# Patient Record
Sex: Female | Born: 1945 | Race: White | Hispanic: No | Marital: Single | Smoking: Never smoker
Health system: Southern US, Community
[De-identification: ages and names within clinical notes are randomized; demographics above are authoritative.]

## PROBLEM LIST (undated history)

## (undated) DIAGNOSIS — H359 Unspecified retinal disorder: Secondary | ICD-10-CM

## (undated) DIAGNOSIS — M1990 Unspecified osteoarthritis, unspecified site: Secondary | ICD-10-CM

## (undated) DIAGNOSIS — I10 Essential (primary) hypertension: Secondary | ICD-10-CM

## (undated) DIAGNOSIS — F0390 Unspecified dementia without behavioral disturbance: Secondary | ICD-10-CM

## (undated) DIAGNOSIS — C801 Malignant (primary) neoplasm, unspecified: Secondary | ICD-10-CM

## (undated) HISTORY — DX: Unspecified dementia, unspecified severity, without behavioral disturbance, psychotic disturbance, mood disturbance, and anxiety: F03.90

## (undated) HISTORY — DX: Essential (primary) hypertension: I10

## (undated) HISTORY — PX: TONSILLECTOMY: SUR1361

## (undated) HISTORY — PX: ABDOMINAL HYSTERECTOMY: SHX81

## (undated) HISTORY — PX: JOINT REPLACEMENT: SHX530

---

## 2016-09-01 ENCOUNTER — Ambulatory Visit
Admission: RE | Admit: 2016-09-01 | Discharge: 2016-09-01 | Disposition: A | Discharge status: Home / Self Care | Payer: Medicare Other | Source: Ambulatory Visit | Attending: Student | Admitting: Student

## 2016-09-01 ENCOUNTER — Other Ambulatory Visit: Payer: Self-pay | Admitting: Student

## 2016-09-01 DIAGNOSIS — M25562 Pain in left knee: Secondary | ICD-10-CM

## 2016-09-01 DIAGNOSIS — R600 Localized edema: Secondary | ICD-10-CM

## 2017-02-04 ENCOUNTER — Other Ambulatory Visit: Payer: Self-pay | Admitting: Neurology

## 2017-02-04 DIAGNOSIS — R4189 Other symptoms and signs involving cognitive functions and awareness: Secondary | ICD-10-CM

## 2017-02-15 ENCOUNTER — Encounter: Payer: Self-pay | Admitting: Radiology

## 2017-02-15 ENCOUNTER — Ambulatory Visit
Admission: RE | Admit: 2017-02-15 | Discharge: 2017-02-15 | Disposition: A | Discharge status: Home / Self Care | Payer: Medicare HMO | Source: Ambulatory Visit | Attending: Neurology | Admitting: Neurology

## 2017-02-15 DIAGNOSIS — I6782 Cerebral ischemia: Secondary | ICD-10-CM | POA: Insufficient documentation

## 2017-02-15 DIAGNOSIS — R4189 Other symptoms and signs involving cognitive functions and awareness: Secondary | ICD-10-CM | POA: Insufficient documentation

## 2017-02-15 MED ORDER — GADOBENATE DIMEGLUMINE 529 MG/ML IV SOLN
20.0000 mL | Freq: Once | INTRAVENOUS | Status: AC | PRN
  Administered 2017-02-15: 20 mL via INTRAVENOUS

## 2019-02-23 DIAGNOSIS — Z6841 Body Mass Index (BMI) 40.0 and over, adult: Secondary | ICD-10-CM | POA: Insufficient documentation

## 2019-04-18 ENCOUNTER — Other Ambulatory Visit: Payer: Self-pay | Admitting: Family Medicine

## 2019-04-18 DIAGNOSIS — Z1231 Encounter for screening mammogram for malignant neoplasm of breast: Secondary | ICD-10-CM

## 2019-04-23 DIAGNOSIS — F0280 Dementia in other diseases classified elsewhere without behavioral disturbance: Secondary | ICD-10-CM | POA: Insufficient documentation

## 2019-04-25 ENCOUNTER — Ambulatory Visit
Admission: RE | Admit: 2019-04-25 | Discharge: 2019-04-25 | Disposition: A | Discharge status: Home / Self Care | Payer: Medicare HMO | Source: Ambulatory Visit | Attending: Family Medicine | Admitting: Family Medicine

## 2019-04-25 DIAGNOSIS — Z1231 Encounter for screening mammogram for malignant neoplasm of breast: Secondary | ICD-10-CM | POA: Insufficient documentation

## 2019-05-22 ENCOUNTER — Encounter (INDEPENDENT_AMBULATORY_CARE_PROVIDER_SITE_OTHER): Payer: Medicare HMO | Admitting: Ophthalmology

## 2019-05-28 ENCOUNTER — Encounter (INDEPENDENT_AMBULATORY_CARE_PROVIDER_SITE_OTHER): Payer: Self-pay | Admitting: Ophthalmology

## 2019-05-28 ENCOUNTER — Ambulatory Visit (INDEPENDENT_AMBULATORY_CARE_PROVIDER_SITE_OTHER): Payer: Medicare HMO | Admitting: Ophthalmology

## 2019-05-28 ENCOUNTER — Other Ambulatory Visit: Payer: Self-pay

## 2019-05-28 DIAGNOSIS — H3561 Retinal hemorrhage, right eye: Secondary | ICD-10-CM

## 2019-05-28 DIAGNOSIS — H2511 Age-related nuclear cataract, right eye: Secondary | ICD-10-CM | POA: Diagnosis not present

## 2019-05-28 DIAGNOSIS — H2510 Age-related nuclear cataract, unspecified eye: Secondary | ICD-10-CM | POA: Insufficient documentation

## 2019-05-28 DIAGNOSIS — H348312 Tributary (branch) retinal vein occlusion, right eye, stable: Secondary | ICD-10-CM

## 2019-05-28 DIAGNOSIS — H2512 Age-related nuclear cataract, left eye: Secondary | ICD-10-CM

## 2019-05-28 HISTORY — DX: Tributary (branch) retinal vein occlusion, right eye, stable: H34.8312

## 2019-05-28 HISTORY — DX: Retinal hemorrhage, right eye: H35.61

## 2019-05-28 NOTE — Progress Notes (Signed)
05/28/2019     CHIEF COMPLAINT Patient presents for Retina Follow Up   HISTORY OF PRESENT ILLNESS: Kimberly Small is a 74 y.o. female who presents to the clinic today for:   HPI    Retina Follow Up    Patient presents with  CRVO/BRVO.  In both eyes.  Duration of 1 year.  Since onset it is stable.          Comments    1 year follow up - OCT OU Patient denies change in vision and overall has no complaints.        Last edited by Gerda Diss on 05/28/2019  9:49 AM. (History)      Referring physician: [name], MD [address] [facility] [place],  [place] [address]  HISTORICAL INFORMATION:   Selected notes from the MEDICAL RECORD NUMBER       CURRENT MEDICATIONS: Current Outpatient Medications (Ophthalmic Drugs)  Medication Sig  . Polyethyl Glycol-Propyl Glycol 0.4-0.3 % SOLN Apply to eye.   No current facility-administered medications for this visit. (Ophthalmic Drugs)   Current Outpatient Medications (Other)  Medication Sig  . donepezil (ARICEPT) 5 MG tablet Take by mouth.  . losartan (COZAAR) 25 MG tablet Take by mouth.  Marland Kitchen aspirin 81 MG chewable tablet Chew by mouth.  . Cholecalciferol 25 MCG (1000 UT) tablet Take by mouth.  . cyanocobalamin 1000 MCG tablet Take by mouth.  . Multiple Vitamins-Minerals (MULTIVITAMIN ADULT EXTRA C PO) Take by mouth.   No current facility-administered medications for this visit. (Other)      REVIEW OF SYSTEMS:    ALLERGIES Allergies  Allergen Reactions  . Erythromycin     PAST MEDICAL HISTORY Past Medical History:  Diagnosis Date  . Dementia ([place])   . Hypertension    History reviewed. No pertinent surgical history.  FAMILY HISTORY Family History  Problem Relation Age of Onset  . Breast cancer Neg Hx     SOCIAL HISTORY Social History   Tobacco Use  . Smoking status: Never Smoker  . Smokeless tobacco: Never Used  Substance Use Topics  . Alcohol use: Not on file  . Drug use: Not on file          OPHTHALMIC EXAM: Base Eye Exam    Visual Acuity (Snellen - Linear)      Right Left   Dist [place] 20/40-2 20/25-2   Dist ph [place] NI    Correction: Glasses       Tonometry (Tonopen, 9:56 AM)      Right Left   Pressure 13 12       Pupils      Pupils Dark Light Shape React APD   Right PERRL 6 4 Round Brisk None   Left PERRL 6 4 Round Brisk None       Visual Fields (Counting fingers)      Left Right    Full Full       Extraocular Movement      Right Left    Full Full       Neuro/Psych    Oriented x3: Yes   Mood/Affect: Normal       Dilation    Both eyes: 1.0% Mydriacyl, 2.5% Phenylephrine @ 9:56 AM        Slit Lamp and Fundus Exam    External Exam      Right Left   External Normal Normal       Slit Lamp Exam  Right Left   Lids/Lashes Normal Normal   Conjunctiva/Sclera White and quiet White and quiet   Cornea Clear Clear   Anterior Chamber Deep and quiet Deep and quiet   Iris Round and reactive Round and reactive   Vitreous Normal Normal          IMAGING AND PROCEDURES  Imaging and Procedures for 05/28/19  OCT, Retina - OU - Both Eyes       Right Eye Quality was good. Scan locations included subfoveal. Central Foveal Thickness: 300.   Left Eye Quality was good. Scan locations included subfoveal. Central Foveal Thickness: 287.                 ASSESSMENT/PLAN:  No problem-specific Assessment & Plan notes found for this encounter.      ICD-10-CM   1. Branch retinal vein occlusion of right eye, unspecified complication status  0000000   2. Retinal hemorrhage of right eye  H35.61   3. Nuclear sclerotic cataract of left eye  H25.12   4. Nuclear sclerotic cataract of right eye  H25.11     1.  2.  3.  Ophthalmic Meds Ordered this visit:  No orders of the defined types were placed in this encounter.      No follow-ups on file.  There are no Patient Instructions on file for this visit.   Explained the diagnoses,  plan, and follow up with the patient and they expressed understanding.  Patient expressed understanding of the importance of proper follow up care.   Clent Demark Henrick Mcgue M.D. Diseases & Surgery of the Retina and Vitreous [facility] [place] 05/28/19     Abbreviations: M myopia (nearsighted); A astigmatism; H hyperopia (farsighted); P presbyopia; Mrx spectacle prescription;  CTL contact lenses; OD right eye; OS left eye; OU both eyes  XT exotropia; ET esotropia; PEK punctate epithelial keratitis; PEE punctate epithelial erosions; DES dry eye syndrome; MGD meibomian gland dysfunction; ATs artificial tears; PFAT's preservative free artificial tears; [place] nuclear sclerotic cataract; PSC posterior subcapsular cataract; ERM epi-retinal membrane; PVD posterior vitreous detachment; RD retinal detachment; DM diabetes mellitus; DR diabetic retinopathy; NPDR non-proliferative diabetic retinopathy; PDR proliferative diabetic retinopathy; CSME clinically significant macular edema; DME diabetic macular edema; dbh dot blot hemorrhages; CWS cotton wool spot; POAG primary open angle glaucoma; C/D cup-to-disc ratio; HVF humphrey visual field; GVF goldmann visual field; OCT optical coherence tomography; IOP intraocular pressure; BRVO Branch retinal vein occlusion; CRVO central retinal vein occlusion; CRAO central retinal artery occlusion; BRAO branch retinal artery occlusion; RT retinal tear; SB scleral buckle; PPV pars plana vitrectomy; VH Vitreous hemorrhage; PRP panretinal laser photocoagulation; IVK intravitreal kenalog; VMT vitreomacular traction; MH Macular hole;  NVD neovascularization of the disc; NVE neovascularization elsewhere; AREDS age related eye disease study; ARMD age related macular degeneration; POAG primary open angle glaucoma; EBMD epithelial/anterior basement membrane dystrophy; ACIOL anterior chamber intraocular lens; IOL intraocular lens; PCIOL posterior chamber intraocular lens; Phaco/IOL  phacoemulsification with intraocular lens placement; [place] photorefractive keratectomy; LASIK laser assisted in situ keratomileusis; HTN hypertension; DM diabetes mellitus; COPD chronic obstructive pulmonary disease

## 2019-05-28 NOTE — Assessment & Plan Note (Signed)

## 2019-10-08 ENCOUNTER — Other Ambulatory Visit
Admission: RE | Admit: 2019-10-08 | Discharge: 2019-10-08 | Disposition: A | Discharge status: Home / Self Care | Payer: Medicare HMO | Source: Ambulatory Visit | Attending: Internal Medicine | Admitting: Internal Medicine

## 2019-10-08 ENCOUNTER — Other Ambulatory Visit: Payer: Self-pay

## 2019-10-08 DIAGNOSIS — Z01812 Encounter for preprocedural laboratory examination: Secondary | ICD-10-CM | POA: Insufficient documentation

## 2019-10-08 DIAGNOSIS — Z20822 Contact with and (suspected) exposure to covid-19: Secondary | ICD-10-CM | POA: Diagnosis not present

## 2019-10-08 LAB — SARS CORONAVIRUS 2 (TAT 6-24 HRS): SARS Coronavirus 2: NEGATIVE

## 2019-10-09 ENCOUNTER — Encounter: Payer: Self-pay | Admitting: Internal Medicine

## 2019-10-10 ENCOUNTER — Ambulatory Visit: Payer: Medicare HMO | Admitting: Certified Registered"

## 2019-10-10 ENCOUNTER — Encounter
Admission: RE | Disposition: A | Discharge status: Home / Self Care | Payer: Self-pay | Source: Home / Self Care | Attending: Internal Medicine

## 2019-10-10 ENCOUNTER — Ambulatory Visit
Admission: RE | Admit: 2019-10-10 | Discharge: 2019-10-10 | Disposition: A | Discharge status: Home / Self Care | Payer: Medicare HMO | Attending: Internal Medicine | Admitting: Internal Medicine

## 2019-10-10 ENCOUNTER — Encounter: Payer: Self-pay | Admitting: Internal Medicine

## 2019-10-10 ENCOUNTER — Other Ambulatory Visit: Payer: Self-pay

## 2019-10-10 DIAGNOSIS — D123 Benign neoplasm of transverse colon: Secondary | ICD-10-CM | POA: Insufficient documentation

## 2019-10-10 DIAGNOSIS — Z1211 Encounter for screening for malignant neoplasm of colon: Secondary | ICD-10-CM | POA: Diagnosis not present

## 2019-10-10 DIAGNOSIS — Z79899 Other long term (current) drug therapy: Secondary | ICD-10-CM | POA: Diagnosis not present

## 2019-10-10 DIAGNOSIS — Z7982 Long term (current) use of aspirin: Secondary | ICD-10-CM | POA: Insufficient documentation

## 2019-10-10 DIAGNOSIS — I10 Essential (primary) hypertension: Secondary | ICD-10-CM | POA: Diagnosis not present

## 2019-10-10 DIAGNOSIS — Z8371 Family history of colonic polyps: Secondary | ICD-10-CM | POA: Insufficient documentation

## 2019-10-10 DIAGNOSIS — K5730 Diverticulosis of large intestine without perforation or abscess without bleeding: Secondary | ICD-10-CM | POA: Insufficient documentation

## 2019-10-10 DIAGNOSIS — K640 First degree hemorrhoids: Secondary | ICD-10-CM | POA: Diagnosis not present

## 2019-10-10 DIAGNOSIS — F0390 Unspecified dementia without behavioral disturbance: Secondary | ICD-10-CM | POA: Diagnosis not present

## 2019-10-10 DIAGNOSIS — Z800 Family history of malignant neoplasm of digestive organs: Secondary | ICD-10-CM | POA: Diagnosis not present

## 2019-10-10 DIAGNOSIS — M1990 Unspecified osteoarthritis, unspecified site: Secondary | ICD-10-CM | POA: Insufficient documentation

## 2019-10-10 HISTORY — DX: Unspecified retinal disorder: H35.9

## 2019-10-10 HISTORY — PX: COLONOSCOPY: SHX5424

## 2019-10-10 HISTORY — DX: Unspecified osteoarthritis, unspecified site: M19.90

## 2019-10-10 HISTORY — DX: Malignant (primary) neoplasm, unspecified: C80.1

## 2019-10-10 SURGERY — COLONOSCOPY
Anesthesia: General

## 2019-10-10 MED ORDER — PROPOFOL 500 MG/50ML IV EMUL
INTRAVENOUS | Status: DC | PRN
  Administered 2019-10-10: 155 ug/kg/min via INTRAVENOUS

## 2019-10-10 MED ORDER — PROPOFOL 10 MG/ML IV BOLUS
INTRAVENOUS | Status: DC | PRN
  Administered 2019-10-10 (×2): 10 mg via INTRAVENOUS
  Administered 2019-10-10: 50 mg via INTRAVENOUS

## 2019-10-10 MED ORDER — LIDOCAINE HCL (CARDIAC) PF 100 MG/5ML IV SOSY
PREFILLED_SYRINGE | INTRAVENOUS | Status: DC | PRN
  Administered 2019-10-10: 100 mg via INTRAVENOUS

## 2019-10-10 MED ORDER — SODIUM CHLORIDE 0.9 % IV SOLN: INTRAVENOUS | Status: DC

## 2019-10-10 NOTE — Op Note (Signed)
[facility] Gastroenterology Patient Name: Kimberly Small Procedure Date: 10/10/2019 11:42 AM MRN: [id number] Account #: [id number] Date of Birth: February 01, 1945 Admit Type: Outpatient Age: 74 Room: [facility] ENDO ROOM 4 Gender: Female Note Status: Finalized Procedure:             Colonoscopy Indications:           Colon cancer screening in patient at increased risk:                         Family history of 1st-degree relative with colon                         polyps, Screening in patient at increased risk: Family                         history of 1st-degree relative with colorectal cancer Providers:             Benay Pike. Alice Reichert MD, MD Referring MD:          Irven Easterly. Kary Kos, MD (Referring MD) Medicines:             Propofol per Anesthesia Complications:         No immediate complications. Procedure:             Pre-Anesthesia Assessment:                        - The risks and benefits of the procedure and the                         sedation options and risks were discussed with the                         patient. All questions were answered and informed                         consent was obtained.                        - Patient identification and proposed procedure were                         verified prior to the procedure by the nurse. The                         procedure was verified in the procedure room.                        - ASA Grade Assessment: II - A patient with mild                         systemic disease.                        - After reviewing the risks and benefits, the patient                         was deemed in satisfactory condition to undergo the  procedure.                        After obtaining informed consent, the colonoscope was                         passed under direct vision. Throughout the procedure,                         the patient's blood pressure, pulse, and oxygen                         saturations  were monitored continuously. The                         Colonoscope was introduced through the anus and                         advanced to the the cecum, identified by appendiceal                         orifice and ileocecal valve. The colonoscopy was                         performed without difficulty. The patient tolerated                         the procedure well. The quality of the bowel                         preparation was adequate. The ileocecal valve,                         appendiceal orifice, and rectum were photographed. Findings:      The perianal and digital rectal examinations were normal. Pertinent       negatives include normal sphincter tone and no palpable rectal lesions.      Non-bleeding internal hemorrhoids were found during retroflexion. The       hemorrhoids were Grade I (internal hemorrhoids that do not prolapse).      Many medium-mouthed diverticula were found in the entire colon.      Three sessile polyps were found in the splenic flexure and hepatic       flexure. The polyps were 3 to 5 mm in size. These polyps were removed       with a jumbo cold forceps. Resection and retrieval were complete.      The exam was otherwise without abnormality. Impression:            - Non-bleeding internal hemorrhoids.                        - Diverticulosis in the entire examined colon.                        - Three 3 to 5 mm polyps at the splenic flexure and at                         the hepatic flexure, removed with a jumbo cold  forceps. Resected and retrieved.                        - The examination was otherwise normal. Recommendation:        - Patient has a contact number available for                         emergencies. The signs and symptoms of potential                         delayed complications were discussed with the patient.                         Return to normal activities tomorrow. Written                         discharge  instructions were provided to the patient.                        - Resume previous diet.                        - Continue present medications.                        - If polyps are benign or adenomatous without                         dysplasia, I will advise NO further colonoscopy due to                         advanced age and/or severe comorbidity.                        - Return to GI office PRN.                        - The findings and recommendations were discussed with                         the patient. Procedure Code(s):     --- Professional ---                        [phone], Colonoscopy, flexible; with biopsy, single or                         multiple Diagnosis Code(s):     --- Professional ---                        K57.30, Diverticulosis of large intestine without                         perforation or abscess without bleeding                        K63.5, Polyp of colon                        K64.0, First degree hemorrhoids  Z80.0, Family history of malignant neoplasm of                         digestive organs                        Z83.71, Family history of colonic polyps CPT copyright 2019 American Medical Association. All rights reserved. The codes documented in this report are preliminary and upon coder review may  be revised to meet current compliance requirements. Efrain Sella MD, MD 10/10/2019 12:28:59 PM This report has been signed electronically. Number of Addenda: 0 Note Initiated On: 10/10/2019 11:42 AM Scope Withdrawal Time: 0 hours 17 minutes 40 seconds  Total Procedure Duration: 0 hours 26 minutes 3 seconds  Estimated Blood Loss:  Estimated blood loss: none.      [facility]

## 2019-10-10 NOTE — Transfer of Care (Signed)
Immediate Anesthesia Transfer of Care Note  Patient: SHANDELLE BORRELLI  Procedure(s) Performed: COLONOSCOPY (N/A )  Patient Location: Endoscopy Unit  Anesthesia Type:General  Level of Consciousness: drowsy, patient cooperative and responds to stimulation  Airway & Oxygen Therapy: Patient Spontanous Breathing and Patient connected to face mask oxygen  Post-op Assessment: Report given to RN and Post -op Vital signs reviewed and stable  Post vital signs: Reviewed and stable  Last Vitals:  Vitals Value Taken Time  BP 113/65 10/10/19 1231  Temp 36.5 C 10/10/19 1230  Pulse 53 10/10/19 1232  Resp 12 10/10/19 1232  SpO2 100 % 10/10/19 1232  Vitals shown include unvalidated device data.  Last Pain:  Vitals:   10/10/19 1230  TempSrc: Temporal  PainSc: Asleep         Complications: No complications documented.

## 2019-10-10 NOTE — Anesthesia Procedure Notes (Signed)
Procedure Name: General with mask airway Performed by: Fletcher-Harrison, Amado Andal, CRNA Pre-anesthesia Checklist: Patient identified, Emergency Drugs available, Suction available and Patient being monitored Patient Re-evaluated:Patient Re-evaluated prior to induction Oxygen Delivery Method: Simple face mask Induction Type: IV induction Placement Confirmation: positive ETCO2 and CO2 detector       

## 2019-10-10 NOTE — H&P (Signed)
Outpatient short stay form Pre-procedure 10/10/2019 11:15 AM Kimberly Small K. Alice Reichert, M.D.  Primary Physician: [name], M.D.  Reason for visit: Family history of colon cancer and polyps  History of present illness:   74 year old patient presenting for family history of colon cancer and polyps. Patient denies any change in bowel habits, rectal bleeding or involuntary weight loss.    Current Facility-Administered Medications:  .  0.9 %  sodium chloride infusion, , Intravenous, Continuous, Moody, Benay Pike, MD, Last Rate: 20 mL/hr at 10/10/19 1114, New Bag at 10/10/19 1114  Medications Prior to Admission  Medication Sig Dispense Refill Last Dose  . aspirin 81 MG chewable tablet Chew by mouth.   10/08/19  . Cholecalciferol 25 MCG (1000 UT) tablet Take by mouth.   10/08/19  . cyanocobalamin 1000 MCG tablet Take by mouth.   10/08/19  . donepezil (ARICEPT) 5 MG tablet Take by mouth.   10/08/19  . losartan (COZAAR) 25 MG tablet Take by mouth.   10/08/19  . Multiple Vitamins-Minerals (MULTIVITAMIN ADULT EXTRA C PO) Take by mouth.     Kimberly Small 0.4-0.3 % SOLN Apply to eye.        Allergies  Allergen Reactions  . Erythromycin      Past Medical History:  Diagnosis Date  . Arthritis   . Cancer ([place])   . Dementia ([place])   . Hypertension   . Retina disorder, right     Review of systems:  Otherwise negative.    Physical Exam  Gen: Alert, oriented. Appears stated age.  HEENT: [place]/AT. PERRLA. Lungs: CTA, no wheezes. CV: RR nl S1, S2. Abd: soft, benign, no masses. BS+ Ext: No edema. Pulses 2+    Planned procedures: Proceed with colonoscopy. The patient understands the nature of the planned procedure, indications, risks, alternatives and potential complications including but not limited to bleeding, infection, perforation, damage to internal organs and possible oversedation/side effects from anesthesia. The patient agrees and gives consent to proceed.  Please  refer to procedure notes for findings, recommendations and patient disposition/instructions.     [name] K. Alice Reichert, M.D. Gastroenterology 10/10/2019  11:15 AM

## 2019-10-10 NOTE — Anesthesia Postprocedure Evaluation (Signed)
Anesthesia Post Note  Patient: Kimberly Small  Procedure(s) Performed: COLONOSCOPY (N/A )  Patient location during evaluation: Endoscopy Anesthesia Type: General Level of consciousness: awake and alert and oriented Pain management: pain level controlled Vital Signs Assessment: post-procedure vital signs reviewed and stable Respiratory status: spontaneous breathing, nonlabored ventilation and respiratory function stable Cardiovascular status: blood pressure returned to baseline and stable Postop Assessment: no signs of nausea or vomiting Anesthetic complications: no   No complications documented.   Last Vitals:  Vitals:   10/10/19 1250 10/10/19 1300  BP: (!) 119/104 136/74  Pulse: (!) 52 (!) 52  Resp: 15 (!) 22  Temp:    SpO2: 97% 98%    Last Pain:  Vitals:   10/10/19 1300  TempSrc:   PainSc: 0-No pain                 Yonna Alwin

## 2019-10-10 NOTE — Anesthesia Preprocedure Evaluation (Signed)
Anesthesia Evaluation  Patient identified by MRN, date of birth, ID band Patient awake    Reviewed: Allergy & Precautions, NPO status , Patient's Chart, lab work & pertinent test results  History of Anesthesia Complications Negative for: history of anesthetic complications  Airway Mallampati: II  TM Distance: >3 FB Neck ROM: Full    Dental  (+) Poor Dentition   Pulmonary neg pulmonary ROS, neg sleep apnea, neg COPD,    breath sounds clear to auscultation- rhonchi (-) wheezing      Cardiovascular hypertension, Pt. on medications (-) CAD, (-) Past MI, (-) Cardiac Stents and (-) CABG  Rhythm:Regular Rate:Normal - Systolic murmurs and - Diastolic murmurs    Neuro/Psych PSYCHIATRIC DISORDERS Dementia negative neurological ROS     GI/Hepatic negative GI ROS, Neg liver ROS,   Endo/Other  negative endocrine ROSneg diabetes  Renal/GU negative Renal ROS     Musculoskeletal  (+) Arthritis ,   Abdominal (+) + obese,   Peds  Hematology negative hematology ROS (+)   Anesthesia Other Findings Past Medical History: No date: Arthritis No date: Cancer (HCC) No date: Dementia (HCC) No date: Hypertension No date: Retina disorder, right   Reproductive/Obstetrics                             Anesthesia Physical Anesthesia Plan  ASA: III  Anesthesia Plan: General   Post-op Pain Management:    Induction: Intravenous  PONV Risk Score and Plan: 2 and Propofol infusion  Airway Management Planned: Natural Airway  Additional Equipment:   Intra-op Plan:   Post-operative Plan:   Informed Consent: I have reviewed the patients History and Physical, chart, labs and discussed the procedure including the risks, benefits and alternatives for the proposed anesthesia with the patient or authorized representative who has indicated his/her understanding and acceptance.     Dental advisory given  Plan  Discussed with: CRNA and Anesthesiologist  Anesthesia Plan Comments:         Anesthesia Quick Evaluation

## 2019-10-11 ENCOUNTER — Encounter: Payer: Self-pay | Admitting: Internal Medicine

## 2019-10-11 LAB — SURGICAL PATHOLOGY

## 2020-05-27 ENCOUNTER — Encounter (INDEPENDENT_AMBULATORY_CARE_PROVIDER_SITE_OTHER): Payer: Medicare HMO | Admitting: Ophthalmology

## 2021-06-22 LAB — CBC AND DIFFERENTIAL
HCT: 36 (ref 36–46)
Hemoglobin: 12.1 (ref 12.0–16.0)
Platelets: 253 10*3/uL (ref 150–400)
WBC: 4.9

## 2021-06-22 LAB — BASIC METABOLIC PANEL
BUN: 17 (ref 4–21)
CO2: 30 — AB (ref 13–22)
Chloride: 106 (ref 99–108)
Creatinine: 0.9 (ref 0.5–1.1)
Glucose: 89
Potassium: 4.2 mEq/L (ref 3.5–5.1)
Sodium: 140 (ref 137–147)

## 2021-06-22 LAB — CBC: RBC: 3.9 (ref 3.87–5.11)

## 2021-06-22 LAB — COMPREHENSIVE METABOLIC PANEL
Albumin: 3.7 (ref 3.5–5.0)
Calcium: 9.3 (ref 8.7–10.7)
Globulin: 3
eGFR: 68

## 2021-06-22 LAB — HEPATIC FUNCTION PANEL
ALT: 11 U/L (ref 7–35)
AST: 13 (ref 13–35)
Alkaline Phosphatase: 63 (ref 25–125)
Bilirubin, Total: 0.5

## 2021-07-22 DIAGNOSIS — I10 Essential (primary) hypertension: Secondary | ICD-10-CM | POA: Diagnosis not present

## 2021-07-22 DIAGNOSIS — G4723 Circadian rhythm sleep disorder, irregular sleep wake type: Secondary | ICD-10-CM | POA: Diagnosis not present

## 2021-07-22 DIAGNOSIS — F22 Delusional disorders: Secondary | ICD-10-CM | POA: Diagnosis not present

## 2021-08-24 DIAGNOSIS — I10 Essential (primary) hypertension: Secondary | ICD-10-CM

## 2021-08-24 DIAGNOSIS — F0150 Vascular dementia without behavioral disturbance: Secondary | ICD-10-CM | POA: Diagnosis not present

## 2021-08-24 DIAGNOSIS — G301 Alzheimer's disease with late onset: Secondary | ICD-10-CM

## 2021-09-20 IMAGING — MG DIGITAL SCREENING BILAT W/ TOMO W/ CAD
6 of 10 series · 6 of 30 positions shown · non-contrast
Comparison: None.

ACR Breast Density Category a: The breast tissue is almost entirely
fatty.

CLINICAL DATA: Screening.

EXAM:
DIGITAL SCREENING BILATERAL MAMMOGRAM WITH TOMO AND CAD

[L MLO synth-2D]
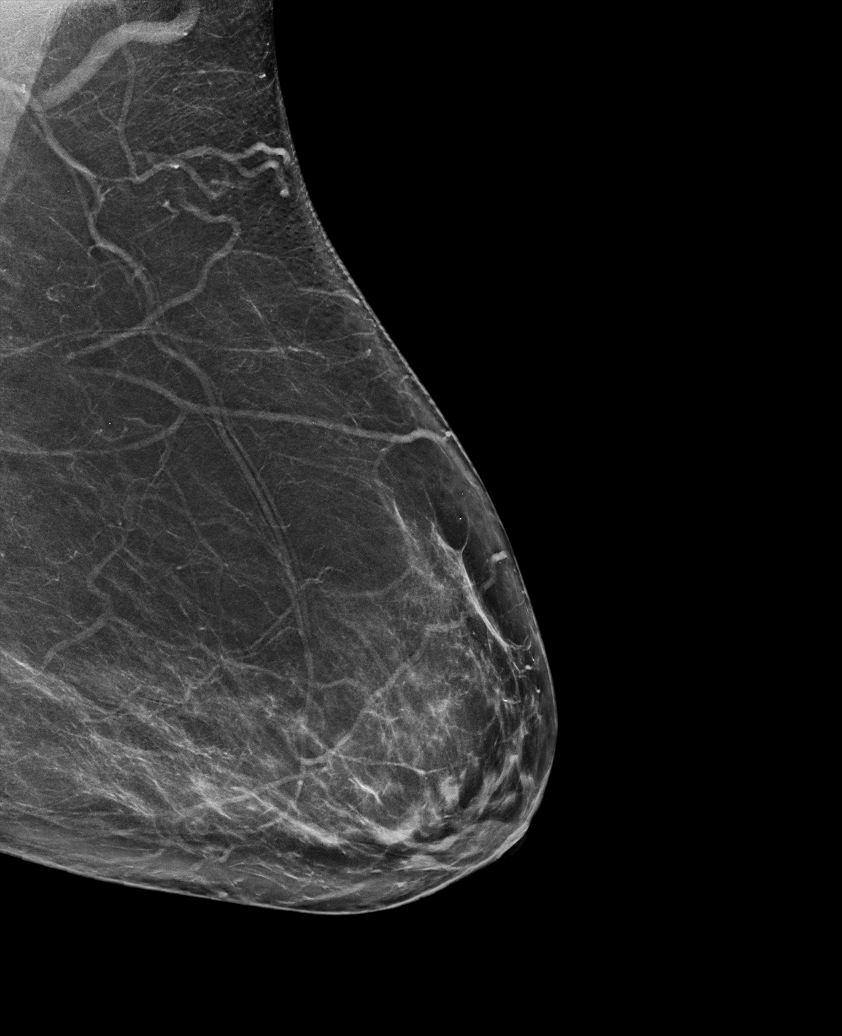

[R MLO synth-2D (1 of 2)]
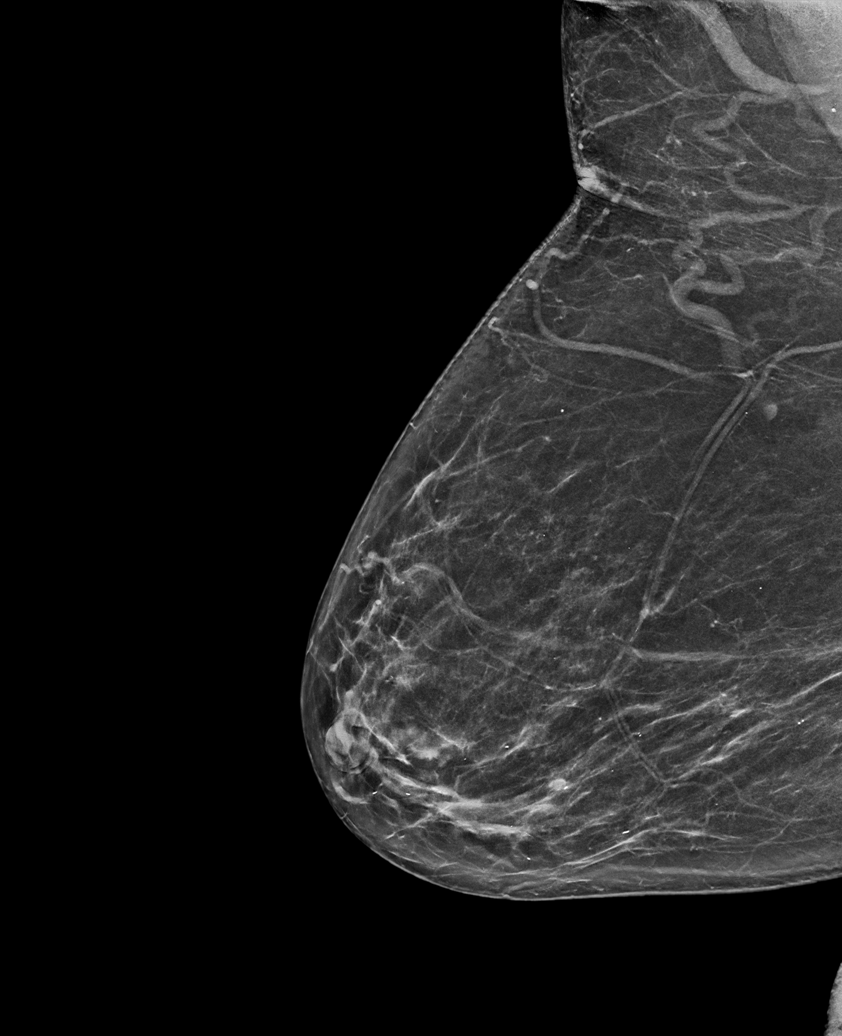

[R CC synth-2D]
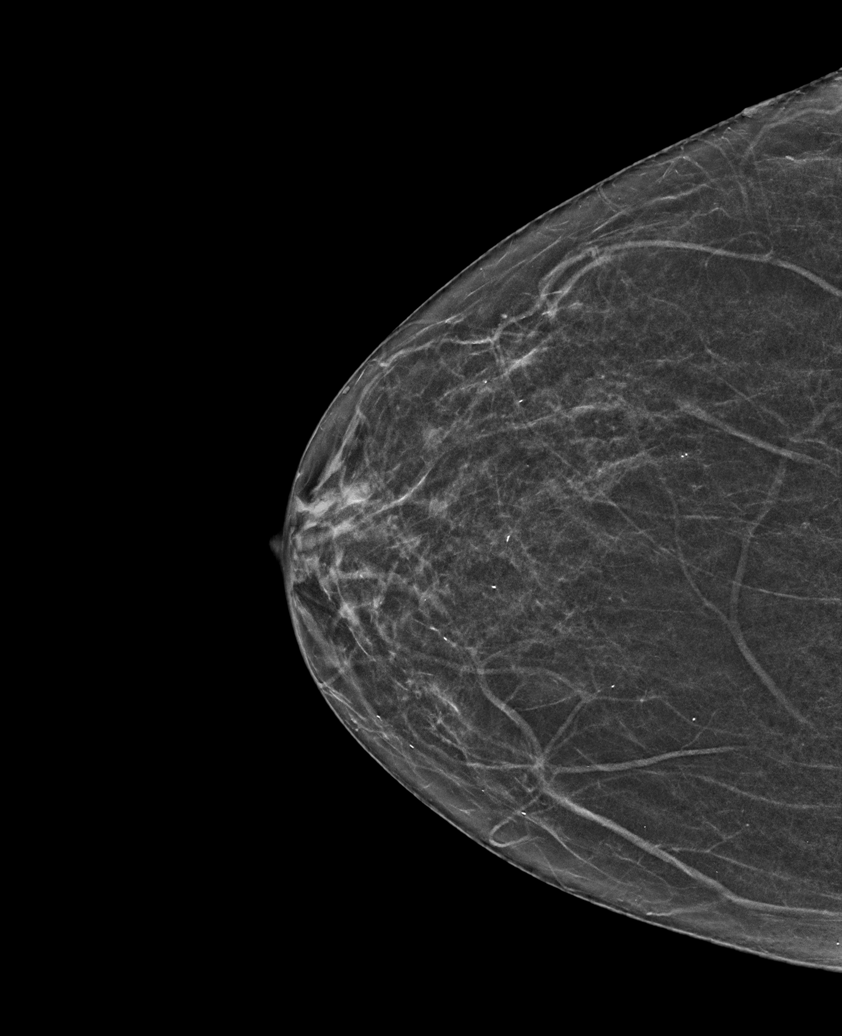

[R MLO synth-2D (2 of 2)]
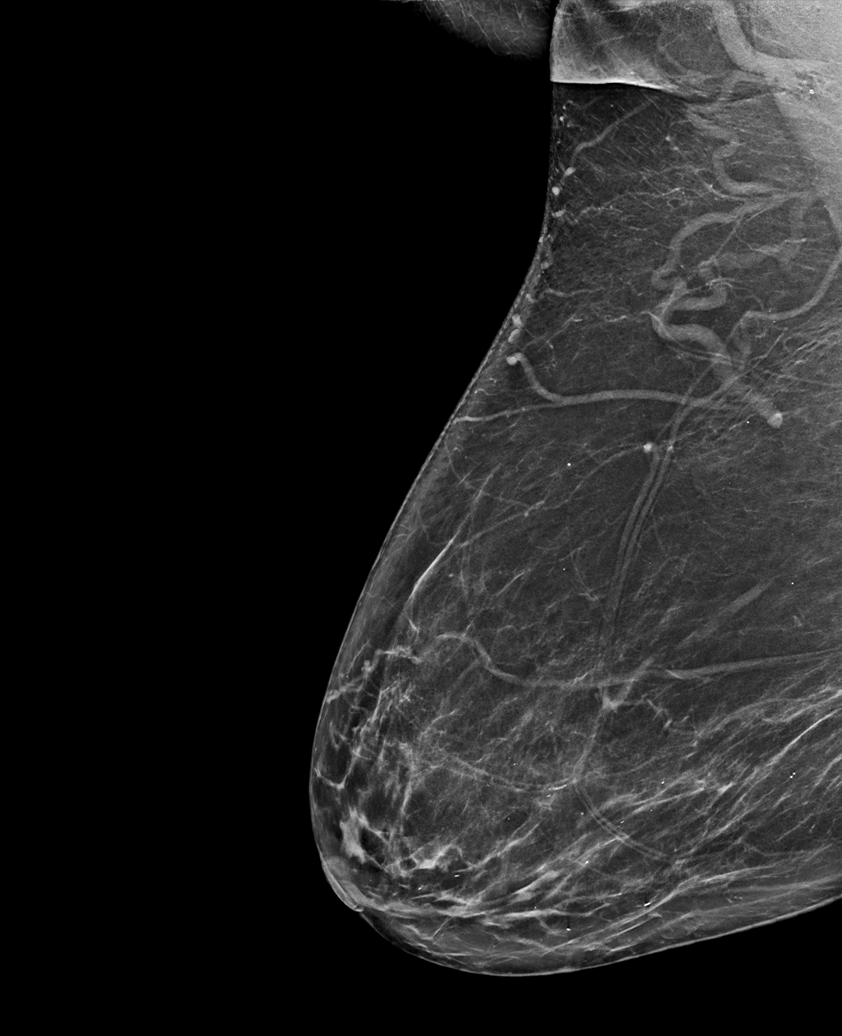

[L CC synth-2D]
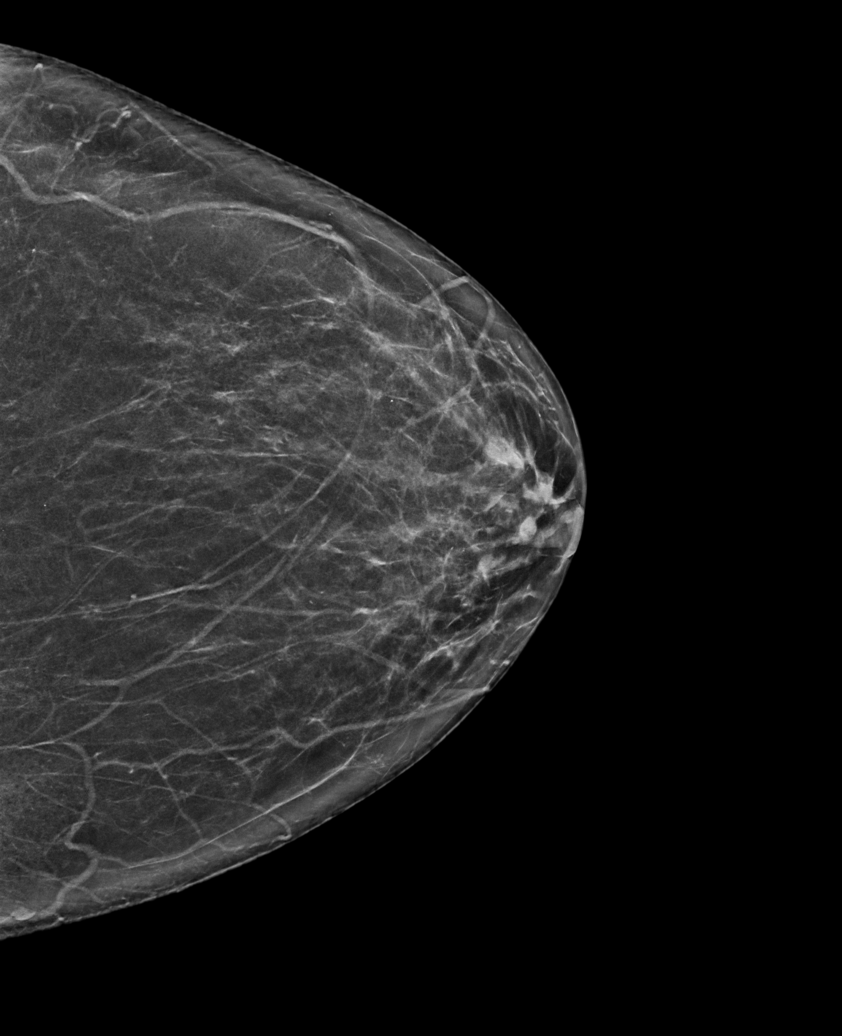

[R MLO tomo · tomo slice 35/70.0]
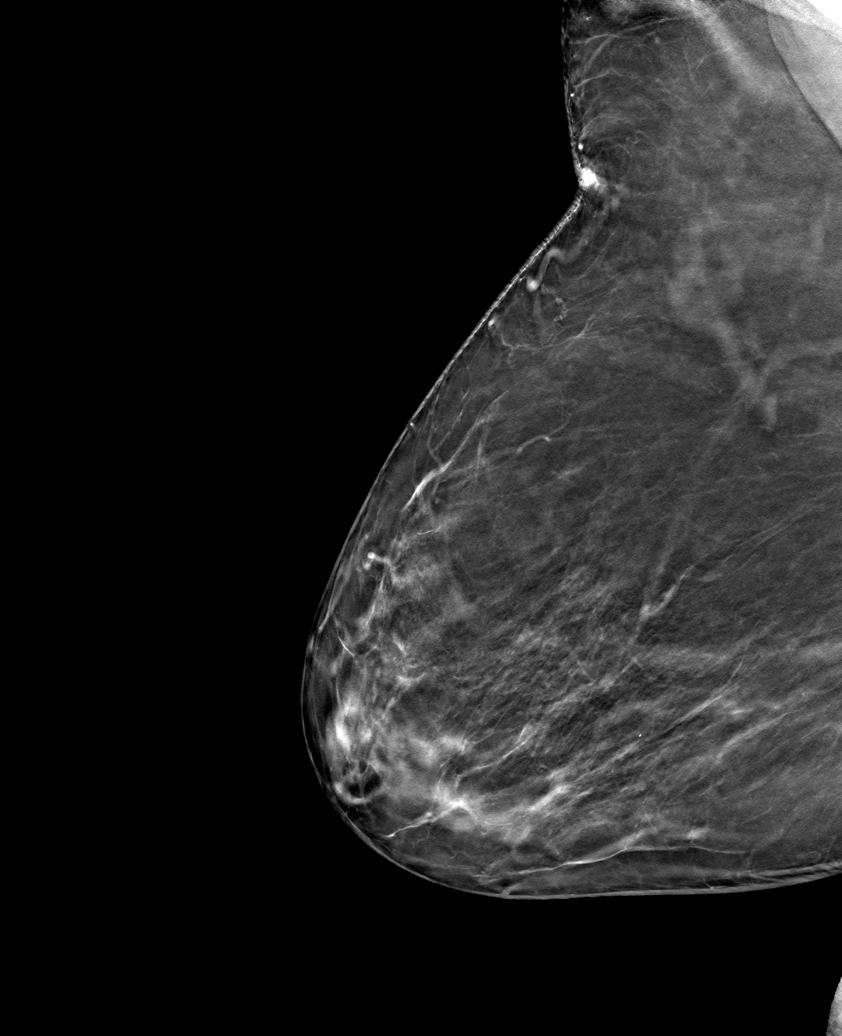

[6 of 30 positions shown; findings below may reference images not displayed]

FINDINGS: There are no findings suspicious for malignancy. Images were
processed with CAD.
IMPRESSION: No mammographic evidence of malignancy. A result letter of this
screening mammogram will be mailed directly to the patient.

RECOMMENDATION:
Screening mammogram in one year. (Code:[id number])

BI-RADS CATEGORY  1: Negative.

## 2021-10-05 ENCOUNTER — Encounter: Payer: Self-pay | Admitting: Student

## 2021-10-05 ENCOUNTER — Non-Acute Institutional Stay (SKILLED_NURSING_FACILITY): Payer: Medicare HMO | Admitting: Student

## 2021-10-05 DIAGNOSIS — I10 Essential (primary) hypertension: Secondary | ICD-10-CM

## 2021-10-05 DIAGNOSIS — G4733 Obstructive sleep apnea (adult) (pediatric): Secondary | ICD-10-CM | POA: Diagnosis not present

## 2021-10-05 DIAGNOSIS — F0280 Dementia in other diseases classified elsewhere without behavioral disturbance: Secondary | ICD-10-CM

## 2021-10-05 DIAGNOSIS — G309 Alzheimer's disease, unspecified: Secondary | ICD-10-CM

## 2021-10-05 DIAGNOSIS — F22 Delusional disorders: Secondary | ICD-10-CM

## 2021-10-05 DIAGNOSIS — R490 Dysphonia: Secondary | ICD-10-CM | POA: Diagnosis not present

## 2021-10-05 DIAGNOSIS — G4709 Other insomnia: Secondary | ICD-10-CM

## 2021-10-05 DIAGNOSIS — F0150 Vascular dementia without behavioral disturbance: Secondary | ICD-10-CM

## 2021-10-05 DIAGNOSIS — Z6841 Body Mass Index (BMI) 40.0 and over, adult: Secondary | ICD-10-CM

## 2021-10-05 NOTE — Progress Notes (Signed)
Location:  Other Nursing Home Room Number: 170-Y Place of Service:  ALF (13) Provider:  Dewayne Shorter, MD   Dewayne Shorter, MD  Patient Care Team: Dewayne Shorter, MD as PCP - General [facility] Medicine)  Extended Emergency Contact Information Primary Emergency Contact: Ellamae Sia, [place] [address] Johnnette Litter of [place] Phone: [phone] Mobile Phone: [phone] Relation: Son Secondary Emergency Contact: Christell Constant, [place] [address] Johnnette Litter of [place] Phone: [phone] Mobile Phone: [phone] Relation: Daughter  Code Status:  Full Code Goals of care: Advanced Directive information    10/05/2021    1:45 PM  Advanced Directives  Does Patient Have a Medical Advance Directive? No  Would patient like information on creating a medical advance directive? No - Patient declined     Chief Complaint  Patient presents with   Routine    HPI:  Pt is a 76 y.o. female seen today for medical management of chronic diseases.  Patient states she feels like her normal self and would like to go to the day center. She denies sore throat, urinary problems, diarrhea. She enjoys reading Alverda Skeans books and plans to continue with this genre.   Discussed with nursing that patient woke up this morning sounding hoarse. They are wondering if she should stay out of day center for the next few days. She had a negative rapid COVID test this morning. Per [facility] chart, patient is full code.    Past Medical History:  Diagnosis Date   Arthritis    Cancer ([place])    Dementia ([place])    Hypertension    Retina disorder, right    Past Surgical History:  Procedure Laterality Date   ABDOMINAL HYSTERECTOMY     COLONOSCOPY N/A 10/10/2019   Procedure: COLONOSCOPY;  Surgeon: Toledo, Benay Pike, MD;  Location: [facility] ENDOSCOPY;  Service: Gastroenterology;  Laterality: N/A;   JOINT REPLACEMENT     TONSILLECTOMY      Allergies  Allergen Reactions    Erythromycin     Outpatient Encounter Medications as of 10/05/2021  Medication Sig   aspirin 81 MG chewable tablet Chew 81 mg by mouth daily.   B Complex Vitamins (VITAMIN B COMPLEX PO) Take by mouth. Give 1 tablet by mouth one time a day for supplement   Cholecalciferol 25 MCG (1000 UT) tablet Take 1,000 Units by mouth daily.   donepezil (ARICEPT) 5 MG tablet Take 5 mg by mouth at bedtime.   guaifenesin (ROBITUSSIN) 100 MG/5ML syrup Take 10 mLs by mouth every 4 (four) hours as needed for cough.   losartan (COZAAR) 50 MG tablet Take 50 mg by mouth daily.   melatonin 5 MG TABS Take 5 mg by mouth at bedtime.   Multiple Vitamins-Minerals (MULTIVITAMIN ADULT EXTRA C PO) Take by mouth.   QUEtiapine (SEROQUEL) 25 MG tablet Take 12 mg by mouth at bedtime. every Tue, Wed,Fri, Sat, Sun for mood   cyanocobalamin 1000 MCG tablet Take by mouth. (Patient not taking: Reported on 10/05/2021)   Polyethyl Glycol-Propyl Glycol 0.4-0.3 % SOLN Apply to eye. (Patient not taking: Reported on 10/05/2021)   [DISCONTINUED] losartan (COZAAR) 25 MG tablet Take by mouth.   No facility-administered encounter medications on file as of 10/05/2021.    Review of Systems  Constitutional:  Negative for activity change.  HENT:  Positive for voice change. Negative for sore throat.   Respiratory:  Negative for shortness of breath.  Cardiovascular:  Negative for chest pain and leg swelling.  Gastrointestinal:  Negative for abdominal distention, constipation and diarrhea.  Genitourinary:  Negative for dysuria.   Immunization History  Administered Date(s) Administered   PFIZER Comirnaty(Gray Top)Covid-19 Tri-Sucrose Vaccine 01/17/2019, 02/07/2019   Pfizer Covid-19 Vaccine Bivalent Booster 27yr & up 10/11/2020   Pertinent  Health Maintenance Due  Topic Date Due   DEXA SCAN  Never done   INFLUENZA VACCINE  Never done   COLONOSCOPY (Pts 45-444yrInsurance coverage will need to be confirmed)  Discontinued      10/10/2019    10:54 AM  Fall Risk  Patient Fall Risk Level Moderate fall risk   Functional Status Survey:    Vitals:   10/05/21 1344  BP: (!) 149/77  Pulse: (!) 51  Resp: 14  Temp: (!) 97.3 F (36.3 C)  SpO2: 98%  Weight: 249 lb 9.6 oz (113.2 kg)  Height: '5\' 4"'$  (1.626 m)   Body mass index is 42.84 kg/m. Physical Exam Constitutional:      Appearance: Normal appearance.  HENT:     Mouth/Throat:     Mouth: Mucous membranes are moist.     Pharynx: Oropharynx is clear. No oropharyngeal exudate or posterior oropharyngeal erythema.  Cardiovascular:     Rate and Rhythm: Normal rate and regular rhythm.  Pulmonary:     Effort: Pulmonary effort is normal.     Breath sounds: Normal breath sounds.  Abdominal:     General: Abdomen is flat. Bowel sounds are normal. There is no distension.     Palpations: Abdomen is soft.  Skin:    General: Skin is warm and dry.  Neurological:     Mental Status: She is alert.     Comments: Oriented to self, location    Labs reviewed: Recent Labs    06/22/21 0000  NA 140  K 4.2  CL 106  CO2 30*  BUN 17  CREATININE 0.9  CALCIUM 9.3   Recent Labs    06/22/21 0000  AST 13  ALT 11  ALKPHOS 63  ALBUMIN 3.7   Recent Labs    06/22/21 0000  WBC 4.9  HGB 12.1  HCT 36  PLT 253   No results found for: "TSH" No results found for: "HGBA1C" No results found for: "CHOL", "HDL", "LDLCALC", "LDLDIRECT", "TRIG", "CHOLHDL"  Significant Diagnostic Results in last 30 days:  No results found.  Assessment/Plan 1. Hoarseness Patient with new hoarseness. No other symptoms. Negative COVID test today, will repeat at 48 hours. If negative will plan to return to normal activities. Guaifenesin 100 mg/5 mL PRN if patient develops additional symptoms.   2. Delusional disorders (HCSenecaPatient has had a history of delusions associated with her alzheimer's dementia. No concerns at this time. Previously decreased Seroquel frequency to at bedtime every Tue, Wed, Fri,  Sat, Sun  with a goal of discontinuing. Stable on Seroquel 12.5 mg a few days per week. Will continue to work on wean to discontinuation.   3. Obstructive sleep apnea Patient with prior diagnosis.   4. Other insomnia No concerns at this time. Continue Melatonin 5 mg.   5. Primary hypertension BP slightly above goal today. Per chart review, patient's BP has been borderline level in the 150's for some time -- noted in 8/28 note as well by previous provider. Will plan to increase losartan dose and monitor BP. Labs ordered for November with CMP and CBC.   6. Mixed Alzheimer's and vascular dementia (HRedmond [facility] Medical CenterPatient with Alzheimer's  dementia and lives in memory care unit. Remains conversational and reads for her hobby. Enjoys day center activities at the [facility]. Patient is ambulatory and continent therefore her FAST score is 5 at this time. Weight is stable. Continue to monitor for progression or changes. Continue Donepezil 10 mg nightly.   7. BMI 45.0-49.9, adult ([place]) Continue to monitor intake. CMP with routine labs. Hgb A1c with Routine labs.    Family/ staff Communication: Discussed plan with nursing staff.   Labs/tests ordered:  Labs ordered for November 2023 - CBC, CMP, A1c.   Tomasa Rand, MD, [facility] [phone]

## 2021-10-07 ENCOUNTER — Encounter: Payer: Self-pay | Admitting: Student

## 2021-10-07 DIAGNOSIS — G4733 Obstructive sleep apnea (adult) (pediatric): Secondary | ICD-10-CM | POA: Insufficient documentation

## 2021-10-07 DIAGNOSIS — G4709 Other insomnia: Secondary | ICD-10-CM | POA: Insufficient documentation

## 2021-10-07 DIAGNOSIS — I10 Essential (primary) hypertension: Secondary | ICD-10-CM | POA: Insufficient documentation

## 2021-10-07 DIAGNOSIS — F22 Delusional disorders: Secondary | ICD-10-CM | POA: Insufficient documentation

## 2021-10-07 DIAGNOSIS — R490 Dysphonia: Secondary | ICD-10-CM | POA: Insufficient documentation

## 2021-11-19 LAB — BASIC METABOLIC PANEL WITH GFR
BUN: 21 (ref 4–21)
CO2: 30 — AB (ref 13–22)
Chloride: 105 (ref 99–108)
Creatinine: 0.9 (ref 0.5–1.1)
Glucose: 91
Potassium: 4.6 meq/L (ref 3.5–5.1)
Sodium: 138 (ref 137–147)

## 2021-11-19 LAB — COMPREHENSIVE METABOLIC PANEL WITH GFR: Calcium: 9.2 (ref 8.7–10.7)

## 2021-12-23 ENCOUNTER — Non-Acute Institutional Stay: Payer: Medicare HMO | Admitting: Student

## 2021-12-23 ENCOUNTER — Encounter: Payer: Self-pay | Admitting: Student

## 2021-12-23 DIAGNOSIS — F0150 Vascular dementia without behavioral disturbance: Secondary | ICD-10-CM | POA: Diagnosis not present

## 2021-12-23 DIAGNOSIS — G309 Alzheimer's disease, unspecified: Secondary | ICD-10-CM

## 2021-12-23 DIAGNOSIS — F22 Delusional disorders: Secondary | ICD-10-CM

## 2021-12-23 DIAGNOSIS — F0280 Dementia in other diseases classified elsewhere without behavioral disturbance: Secondary | ICD-10-CM

## 2021-12-23 DIAGNOSIS — G4709 Other insomnia: Secondary | ICD-10-CM | POA: Diagnosis not present

## 2021-12-23 NOTE — Progress Notes (Unsigned)
Location:  Other [facility] Room Number: [place] of Service:  ALF [phone]) Provider:  Dewayne Shorter, MD  Patient Care Team: Dewayne Shorter, MD as PCP - General (Family Medicine)  Extended Emergency Contact Information Primary Emergency Contact: Ellamae Sia, [place] [address] Johnnette Litter of [place] Phone: [phone] Mobile Phone: [phone] Relation: Son Secondary Emergency Contact: Christell Constant, [place] [address] Johnnette Litter of [place] Phone: [phone] Mobile Phone: [phone] Relation: Daughter  Code Status:  Full Code  Goals of care: Advanced Directive information    12/23/2021    3:20 PM  Advanced Directives  Does Patient Have a Medical Advance Directive? No  Would patient like information on creating a medical advance directive? No - Patient declined     Chief Complaint  Patient presents with   Acute Visit    Night wandering. Vitals and medications are a reflection of [facility] EMR system, [employer] [facility]     HPI:  Pt is a 76 y.o. female seen today for an acute visit for insomnia and wandering. Patient has not been sleeping at night per nursing report. Patient is oriented to self, place, and time, however, does not think she has an issue with sleeping at night.    Past Medical History:  Diagnosis Date   Arthritis    Cancer ([place])    Dementia ([place])    Hypertension    Retina disorder, right    Past Surgical History:  Procedure Laterality Date   ABDOMINAL HYSTERECTOMY     COLONOSCOPY N/A 10/10/2019   Procedure: COLONOSCOPY;  Surgeon: Toledo, Benay Pike, MD;  Location: [facility] ENDOSCOPY;  Service: Gastroenterology;  Laterality: N/A;   JOINT REPLACEMENT     TONSILLECTOMY      Allergies  Allergen Reactions   Erythromycin Rash    Outpatient Encounter Medications as of 12/23/2021  Medication Sig   aspirin 81 MG chewable tablet Chew 81 mg by mouth daily.   B Complex Vitamins (VITAMIN B COMPLEX PO) Take  by mouth. Give 1 tablet by mouth one time a day for supplement   Cholecalciferol 25 MCG (1000 UT) tablet Take 1,000 Units by mouth daily.   losartan (COZAAR) 50 MG tablet Take 50 mg by mouth daily.   melatonin 5 MG TABS Take 5 mg by mouth at bedtime.   Multiple Vitamins-Minerals (MULTIVITAMIN ADULT EXTRA C PO) Take by mouth.   nystatin (MYCOSTATIN/NYSTOP) powder Apply 1 Application topically 2 (two) times daily. Under left breast for skin breakdown   QUEtiapine (SEROQUEL) 25 MG tablet Take 12 mg by mouth at bedtime. every Tue, Wed,Fri, Sat, Sun for mood   traZODone (DESYREL) 100 MG tablet Take 100 mg by mouth at bedtime.   traZODone (DESYREL) 50 MG tablet Take 50 mg by mouth at bedtime. Two listings per [facility] EMR system [employer]   [DISCONTINUED] cyanocobalamin 1000 MCG tablet Take by mouth. (Patient not taking: Reported on 10/05/2021)   [DISCONTINUED] donepezil (ARICEPT) 10 MG tablet Take by mouth.   [DISCONTINUED] guaifenesin (ROBITUSSIN) 100 MG/5ML syrup Take 10 mLs by mouth every 4 (four) hours as needed for cough.   [DISCONTINUED] Polyethyl Glycol-Propyl Glycol 0.4-0.3 % SOLN Apply to eye. (Patient not taking: Reported on 10/05/2021)   No facility-administered encounter medications on file as of 12/23/2021.    Review of Systems  Immunization History  Administered Date(s) Administered   Covid-19, Mrna,Vaccine(Spikevax)54yr and older  11/13/2021   Influenza, High Dose Seasonal PF 09/27/2017, 08/22/2018, 10/11/2020, 10/21/2021   PFIZER Comirnaty(Gray Top)Covid-19 Tri-Sucrose Vaccine 01/17/2019, 02/07/2019, 10/14/2019   PPD Test 12/30/2020   Pfizer Covid-19 Vaccine Bivalent Booster 46yr & up 10/11/2020   Pneumococcal Conjugate-13 02/23/2019   Pneumococcal Polysaccharide-23 03/26/2020   Zoster Recombinat (Shingrix) 02/23/2019   Pertinent  Health Maintenance Due  Topic Date Due   DEXA SCAN  Never done   INFLUENZA VACCINE  Completed   COLONOSCOPY (Pts 45-453yrInsurance  coverage will need to be confirmed)  Discontinued      10/10/2019   10:54 AM  Fall Risk  Patient Fall Risk Level Moderate fall risk   Functional Status Survey:    Vitals:   12/23/21 1518  BP: 137/67  Pulse: 66  Weight: 239 lb (108.4 kg)  Height: '5\' 4"'$  (1.626 m)   Body mass index is 41.02 kg/m. Physical Exam  Labs reviewed: Recent Labs    06/22/21 0000 11/19/21 0000  NA 140 138  K 4.2 4.6  CL 106 105  CO2 30* 30*  BUN 17 21  CREATININE 0.9 0.9  CALCIUM 9.3 9.2   Recent Labs    06/22/21 0000  AST 13  ALT 11  ALKPHOS 63  ALBUMIN 3.7   Recent Labs    06/22/21 0000  WBC 4.9  HGB 12.1  HCT 36  PLT 253   No results found for: "TSH" No results found for: "HGBA1C" No results found for: "CHOL", "HDL", "LDLCALC", "LDLDIRECT", "TRIG", "CHOLHDL"  Significant Diagnostic Results in last 30 days:  No results found.  Assessment/Plan Mixed Alzheimer's and vascular dementia (HCHorse Cave Delusional disorders (HCMoultrie Other insomnia Patient with decreased sleep in the evening. Currently taking 50 mg trazodone. Will increase trazodone to 100 mg nightly. If no improvement, will consider adding antipsychotic treatment and titrating off trazodone given poor results with initial dosing.    Family/ staff Communication: nursing  Labs/tests ordered:  none

## 2021-12-24 ENCOUNTER — Encounter: Payer: Self-pay | Admitting: Student

## 2021-12-30 ENCOUNTER — Encounter: Payer: Self-pay | Admitting: Student

## 2021-12-30 ENCOUNTER — Non-Acute Institutional Stay: Payer: Medicare HMO | Admitting: Student

## 2021-12-30 DIAGNOSIS — F0280 Dementia in other diseases classified elsewhere without behavioral disturbance: Secondary | ICD-10-CM | POA: Diagnosis not present

## 2021-12-30 DIAGNOSIS — G309 Alzheimer's disease, unspecified: Secondary | ICD-10-CM | POA: Diagnosis not present

## 2021-12-30 DIAGNOSIS — F22 Delusional disorders: Secondary | ICD-10-CM | POA: Diagnosis not present

## 2021-12-30 NOTE — Progress Notes (Signed)
Location:  Other [place].  Nursing Home Room Number: [place] Place of Service:  ALF [phone]) Provider:  Dr. Amada Kingfisher, MD  Patient Care Team: Dewayne Shorter, MD as PCP - General [facility] Medicine)  Extended Emergency Contact Information Primary Emergency Contact: [place], [place] [address] Johnnette Litter of [place] Phone: [phone] Mobile Phone: [phone] Relation: Son Secondary Emergency Contact: Christell Constant, [place] [address] Johnnette Litter of [place] Phone: [phone] Mobile Phone: [phone] Relation: Daughter  Code Status:  Full Goals of care: Advanced Directive information    12/30/2021    8:14 AM  Advanced Directives  Does Patient Have a Medical Advance Directive? No  Would patient like information on creating a medical advance directive? No - Patient declined     Chief Complaint  Patient presents with   Acute Visit    Increase Behavior Changes.     HPI:  Pt is a 76 y.o. female seen today for an acute visit for concern for behavioral changes. Patient has been wandering in the halls without her clothes on. Nursing states that patient was saying all weekend she needed to pack her bags so she could go to [place] for the holiday.   Today, she asks how she can get downstairs. She keeps seeing people walk down stairs and she wants to go with them.    Past Medical History:  Diagnosis Date   Arthritis    Cancer ([place])    Dementia ([place])    Hypertension    Retina disorder, right    Past Surgical History:  Procedure Laterality Date   ABDOMINAL HYSTERECTOMY     COLONOSCOPY N/A 10/10/2019   Procedure: COLONOSCOPY;  Surgeon: Toledo, Benay Pike, MD;  Location: [facility] ENDOSCOPY;  Service: Gastroenterology;  Laterality: N/A;   JOINT REPLACEMENT     TONSILLECTOMY      Allergies  Allergen Reactions   Erythromycin Rash    Outpatient Encounter Medications as of 12/30/2021  Medication Sig    aspirin 81 MG chewable tablet Chew 81 mg by mouth daily.   B Complex Vitamins (VITAMIN B COMPLEX PO) Give 1 tablet by mouth one time a day for supplement   Cholecalciferol 25 MCG (1000 UT) tablet Take 1,000 Units by mouth daily.   losartan (COZAAR) 50 MG tablet Take 50 mg by mouth daily.   melatonin 5 MG TABS Take 5 mg by mouth at bedtime.   Multiple Vitamins-Minerals (MULTIVITAMIN ADULT EXTRA C PO) Take 1 tablet by mouth daily.   nystatin (MYCOSTATIN/NYSTOP) powder Apply 1 Application topically 2 (two) times daily. Under left breast for skin breakdown   QUEtiapine (SEROQUEL) 25 MG tablet Take 12.5 mg by mouth at bedtime. every Tue, Wed,Fri, Sat, Sun for mood   traZODone (DESYREL) 100 MG tablet Take 100 mg by mouth at bedtime.   No facility-administered encounter medications on file as of 12/30/2021.    Review of Systems  All other systems reviewed and are negative.   Immunization History  Administered Date(s) Administered   Covid-19, Mrna,Vaccine(Spikevax)63yr and older 11/13/2021   Influenza, High Dose Seasonal PF 09/27/2017, 08/22/2018, 10/11/2020, 10/21/2021   PFIZER Comirnaty(Gray Top)Covid-19 Tri-Sucrose Vaccine 01/17/2019, 02/07/2019, 10/14/2019   PPD Test 12/30/2020   Pfizer Covid-19 Vaccine Bivalent Booster 124yr& up 10/11/2020   Pneumococcal Conjugate-13 02/23/2019   Pneumococcal Polysaccharide-23 03/26/2020   Zoster Recombinat (Shingrix) 02/23/2019   Pertinent  Health Maintenance  Due  Topic Date Due   DEXA SCAN  Never done   INFLUENZA VACCINE  Completed   COLONOSCOPY (Pts 45-52yr Insurance coverage will need to be confirmed)  Discontinued      10/10/2019   10:54 AM  Fall Risk  Patient Fall Risk Level Moderate fall risk   Functional Status Survey:    Vitals:   12/30/21 0808  BP: 134/77  Pulse: 65  Resp: 14  Temp: 98 F (36.7 C)  SpO2: 95%  Weight: 239 lb 6.4 oz (108.6 kg)  Height: '5\' 4"'$  (1.626 m)   Body mass index is 41.09 kg/m. Physical  Exam Vitals reviewed.  Constitutional:      Appearance: Normal appearance.  Cardiovascular:     Rate and Rhythm: Normal rate and regular rhythm.  Pulmonary:     Effort: Pulmonary effort is normal.  Neurological:     Mental Status: She is alert.     Comments: Oriented to self, [facility] time of [facility], 2023.      Labs reviewed: Recent Labs    06/22/21 0000 11/19/21 0000  NA 140 138  K 4.2 4.6  CL 106 105  CO2 30* 30*  BUN 17 21  CREATININE 0.9 0.9  CALCIUM 9.3 9.2   Recent Labs    06/22/21 0000  AST 13  ALT 11  ALKPHOS 63  ALBUMIN 3.7   Recent Labs    06/22/21 0000  WBC 4.9  HGB 12.1  HCT 36  PLT 253   No results found for: "TSH" No results found for: "HGBA1C" No results found for: "CHOL", "HDL", "LDLCALC", "LDLDIRECT", "TRIG", "CHOLHDL"  Significant Diagnostic Results in last 30 days:  No results found.  Assessment/Plan 1. Delusional disorders (HSt. Rosa 2. Alzheimer disease ([facility] Patient has had increased behaviors in the last few weeks. Today, she is oriented, but continues to have visual hallucinations and delusions. Concern for progression of dementia. Plan to Increase seroquel to 25 mg nightly. Keep Trazodone at 50 mg nightly.   Family/ staff Communication: Nursing  Labs/tests ordered:  none

## 2022-02-04 ENCOUNTER — Encounter: Payer: Self-pay | Admitting: Nurse Practitioner

## 2022-02-04 ENCOUNTER — Non-Acute Institutional Stay (SKILLED_NURSING_FACILITY): Payer: Medicare HMO | Admitting: Nurse Practitioner

## 2022-02-04 DIAGNOSIS — G4709 Other insomnia: Secondary | ICD-10-CM

## 2022-02-04 DIAGNOSIS — R600 Localized edema: Secondary | ICD-10-CM

## 2022-02-04 DIAGNOSIS — F22 Delusional disorders: Secondary | ICD-10-CM | POA: Diagnosis not present

## 2022-02-04 DIAGNOSIS — F0280 Dementia in other diseases classified elsewhere without behavioral disturbance: Secondary | ICD-10-CM

## 2022-02-04 DIAGNOSIS — F0150 Vascular dementia without behavioral disturbance: Secondary | ICD-10-CM

## 2022-02-04 DIAGNOSIS — I10 Essential (primary) hypertension: Secondary | ICD-10-CM

## 2022-02-04 DIAGNOSIS — G309 Alzheimer's disease, unspecified: Secondary | ICD-10-CM

## 2022-02-04 NOTE — Progress Notes (Signed)
Location:   [place] Room Number: [place] of Service:  ALF [phone]) Provider:  Sherrie Mustache, NP  Dewayne Shorter, MD  Patient Care Team: Dewayne Shorter, MD as PCP - General [facility] Medicine)  Extended Emergency Contact Information Primary Emergency Contact: Ellamae Sia, [place] [address] Johnnette Litter of [place] [employer] Phone: [phone] Mobile Phone: [phone] Relation: Son Secondary Emergency Contact: Christell Constant, [place] [address] Johnnette Litter of [place] Phone: [phone] Mobile Phone: [phone] Relation: Daughter  Code Status:  FULL CODE Goals of care: Advanced Directive information    02/04/2022    9:03 AM  Advanced Directives  Does Patient Have a Medical Advance Directive? No     Chief Complaint  Patient presents with   Medical Management of Chronic Issues    Routine follow up   Immunizations    Tdap and shingrix vaccine due   Quality Metric Gaps    Medicare annual wellness, Hep C screening and dexa scan due    HPI:  Pt is a 77 y.o. female seen today for medical management of chronic diseases.   Pt is long term in [facility] care at [facility]. Staff reports progressive decline over the last few months.  Staff to help with ADLS Weight has been stable   Past Medical History:  Diagnosis Date   Arthritis    Cancer ([place])    Dementia ([place])    Hypertension    Retina disorder, right    Past Surgical History:  Procedure Laterality Date   ABDOMINAL HYSTERECTOMY     COLONOSCOPY N/A 10/10/2019   Procedure: COLONOSCOPY;  Surgeon: Toledo, Benay Pike, MD;  Location: [facility] ENDOSCOPY;  Service: Gastroenterology;  Laterality: N/A;   JOINT REPLACEMENT     TONSILLECTOMY      Allergies  Allergen Reactions   Erythromycin Rash    Allergies as of 02/04/2022       Reactions   Erythromycin Rash        Medication List        Accurate as of February 04, 2022  9:06 AM. If you have any questions,  ask your nurse or doctor.          aspirin 81 MG chewable tablet Chew 81 mg by mouth daily.   Cholecalciferol 25 MCG (1000 UT) tablet Take 1,000 Units by mouth daily.   losartan 50 MG tablet Commonly known as: COZAAR Take 50 mg by mouth daily.   melatonin 5 MG Tabs Take 5 mg by mouth at bedtime.   MULTIVITAMIN ADULT EXTRA C PO Take 1 tablet by mouth daily.   nystatin powder Commonly known as: MYCOSTATIN/NYSTOP Apply 1 Application topically 2 (two) times daily. Under left breast for skin breakdown   QUEtiapine 25 MG tablet Commonly known as: SEROQUEL Take 12.5 mg by mouth at bedtime. every Tue, Wed,Fri, Sat, Sun for mood   traZODone 100 MG tablet Commonly known as: DESYREL Take 100 mg by mouth at bedtime.   VITAMIN B COMPLEX PO Give 1 tablet by mouth one time a day for supplement        Review of Systems  Unable to perform ROS: Dementia    Immunization History  Administered Date(s) Administered   Covid-19, Mrna,Vaccine(Spikevax)3yr and older 11/13/2021   Influenza, High Dose Seasonal PF 09/27/2017, 08/22/2018, 10/11/2020, 10/21/2021   PFIZER Comirnaty(Gray Top)Covid-19 Tri-Sucrose Vaccine 01/17/2019, 02/07/2019, 10/14/2019   PPD Test 12/30/2020  Pension scheme manager 28yr & up 10/11/2020   Pneumococcal Conjugate-13 02/23/2019   Pneumococcal Polysaccharide-23 03/26/2020   Zoster Recombinat (Shingrix) 02/23/2019   Pertinent  Health Maintenance Due  Topic Date Due   DEXA SCAN  Never done   INFLUENZA VACCINE  Completed   COLONOSCOPY (Pts 45-469yrInsurance coverage will need to be confirmed)  Discontinued      10/10/2019   10:54 AM  Fall Risk  (RETIRED) Patient Fall Risk Level Moderate fall risk   Functional Status Survey:    Vitals:   02/04/22 0900  BP: 134/76  Pulse: (!) 58  Resp: 13  Temp: 98.1 F (36.7 C)  SpO2: 95%  Weight: 253 lb 3.2 oz (114.9 kg)  Height: '5\' 4"'$  (1.626 m)   Body mass index is 43.46  kg/m. Physical Exam Constitutional:      General: She is not in acute distress.    Appearance: She is well-developed. She is not diaphoretic.  HENT:     Head: Normocephalic and atraumatic.     Mouth/Throat:     Pharynx: No oropharyngeal exudate.  Eyes:     Conjunctiva/sclera: Conjunctivae normal.     Pupils: Pupils are equal, round, and reactive to light.  Cardiovascular:     Rate and Rhythm: Normal rate and regular rhythm.     Heart sounds: Normal heart sounds.  Pulmonary:     Effort: Pulmonary effort is normal.     Breath sounds: Normal breath sounds.  Abdominal:     General: Bowel sounds are normal.     Palpations: Abdomen is soft.  Musculoskeletal:        General: No tenderness.     Cervical back: Normal range of motion and neck supple.     Right lower leg: Edema present.     Left lower leg: Edema present.  Skin:    General: Skin is warm and dry.  Neurological:     Mental Status: She is alert.     Labs reviewed: Recent Labs    06/22/21 0000 11/19/21 0000  NA 140 138  K 4.2 4.6  CL 106 105  CO2 30* 30*  BUN 17 21  CREATININE 0.9 0.9  CALCIUM 9.3 9.2   Recent Labs    06/22/21 0000  AST 13  ALT 11  ALKPHOS 63  ALBUMIN 3.7   Recent Labs    06/22/21 0000  WBC 4.9  HGB 12.1  HCT 36  PLT 253   No results found for: "TSH" No results found for: "HGBA1C" No results found for: "CHOL", "HDL", "LDLCALC", "LDLDIRECT", "TRIG", "CHOLHDL"  Significant Diagnostic Results in last 30 days:  No results found.  Assessment/Plan 1. Mixed Alzheimer's and vascular dementia (HCWestmoreland- slow progressive changes in cognitive or functional status, continue supportive care by staff.   2. Morbid obesity (HCSonora-noted, expect weight loss with progressive dementia  3. Delusional disorders (HCOrleans-stable at this time.  4. Other insomnia Continues on trazodone qhs, stable.   5. Primary hypertension Blood pressure well controlled, goal bp <140/90 Continue current  medications and dietary modifications follow metabolic panel  6. Bilateral leg edema --encouraged to elevate legs above level of heart as tolerates, low sodium diet, compression hose as tolerates (on in am, off in pm)  Arelia Volpe K. EuWaynesboroAGPlymouthdult Medicine [phone]

## 2022-03-30 ENCOUNTER — Non-Acute Institutional Stay (INDEPENDENT_AMBULATORY_CARE_PROVIDER_SITE_OTHER): Payer: Medicare HMO | Admitting: Nurse Practitioner

## 2022-03-30 ENCOUNTER — Encounter: Payer: Self-pay | Admitting: Nurse Practitioner

## 2022-03-30 DIAGNOSIS — Z0000 Encounter for general adult medical examination without abnormal findings: Secondary | ICD-10-CM | POA: Diagnosis not present

## 2022-03-30 NOTE — Progress Notes (Signed)
Subjective:   Kimberly Small is a 77 y.o. female who presents for Medicare Annual (Subsequent) preventive examination.  Review of Systems     Cardiac Risk Factors include: advanced age (>76men, >65 women);sedentary lifestyle;hypertension;obesity (BMI >30kg/m2)     Objective:    Today's Vitals   03/30/22 0903  BP: (!) 152/88  Pulse: 60  Resp: 18  Temp: 98.4 F (36.9 C)  SpO2: 94%  Weight: 237 lb (107.5 kg)  Height: 5\' 4"  (1.626 m)   Body mass index is 40.68 kg/m.     03/30/2022    9:15 AM 02/04/2022    9:03 AM 12/30/2021    8:14 AM 12/23/2021    3:20 PM 10/05/2021    1:45 PM 10/10/2019   10:50 AM  Advanced Directives  Does Patient Have a Medical Advance Directive? No No No No No Yes  Type of Advance Directive      Living will  Would patient like information on creating a medical advance directive? No - Patient declined  No - Patient declined No - Patient declined No - Patient declined     Current Medications (verified) Outpatient Encounter Medications as of 03/30/2022  Medication Sig   aspirin 81 MG chewable tablet Chew 81 mg by mouth daily.   B Complex Vitamins (VITAMIN B COMPLEX PO) Give 1 tablet by mouth one time a day for supplement   Cholecalciferol 25 MCG (1000 UT) tablet Take 1,000 Units by mouth daily.   losartan (COZAAR) 50 MG tablet Take 50 mg by mouth daily.   melatonin 5 MG TABS Take 5 mg by mouth at bedtime.   Multiple Vitamins-Minerals (MULTIVITAMIN ADULT EXTRA C PO) Take 1 tablet by mouth daily.   nystatin (MYCOSTATIN/NYSTOP) powder Apply 1 Application topically 2 (two) times daily. Under left breast for skin breakdown   QUEtiapine (SEROQUEL) 25 MG tablet Take 25 mg by mouth daily.   traZODone (DESYREL) 100 MG tablet Take 100 mg by mouth at bedtime.   No facility-administered encounter medications on file as of 03/30/2022.    Allergies (verified) Erythromycin   History: Past Medical History:  Diagnosis Date   Arthritis    Cancer ([place])     Dementia ([place])    Hypertension    Retina disorder, right    Past Surgical History:  Procedure Laterality Date   ABDOMINAL HYSTERECTOMY     COLONOSCOPY N/A 10/10/2019   Procedure: COLONOSCOPY;  Surgeon: Toledo, Benay Pike, MD;  Location: [facility] ENDOSCOPY;  Service: Gastroenterology;  Laterality: N/A;   JOINT REPLACEMENT     TONSILLECTOMY     Family History  Problem Relation Age of Onset   Breast cancer Neg Hx    Social History   Socioeconomic History   Marital status: Single    Spouse name: Not on file   Number of children: Not on file   Years of education: Not on file   Highest education level: Not on file  Occupational History   Not on file  Tobacco Use   Smoking status: Never   Smokeless tobacco: Never  Vaping Use   Vaping Use: Never used  Substance and Sexual Activity   Alcohol use: Yes    Alcohol/week: 1.0 standard drink of alcohol    Types: 1 Glasses of wine per week   Drug use: Never   Sexual activity: Not Currently  Other Topics Concern   Not on file  Social History Narrative   Not on file   Social Determinants of Health   Financial  Resource Strain: Not on file  Food Insecurity: Not on file  Transportation Needs: Not on file  Physical Activity: Not on file  Stress: Not on file  Social Connections: Not on file    Tobacco Counseling Counseling given: Not Answered   Clinical Intake:  Pre-visit preparation completed: Yes  Pain : No/denies pain        How often do you need to have someone help you when you read instructions, pamphlets, or other written materials from your doctor or pharmacy?: [place] of Daily Living    03/30/2022    8:48 AM  In your present state of health, do you have any difficulty performing the following activities:  Hearing? 1  Vision? 0  Difficulty concentrating or making decisions? 1  Walking or climbing stairs? 1  Dressing or bathing? 1  Doing errands, shopping? 1  Preparing  Food and eating ? Y  Using the Toilet? Y  In the past six months, have you accidently leaked urine? Y  Do you have problems with loss of bowel control? Y  Managing your Medications? Y  Managing your Finances? Y  Housekeeping or managing your Housekeeping? Y    Patient Care Team: Dewayne Shorter, MD as PCP - General (Family Medicine)  Indicate any recent Medical Services you may have received from other than [facility] providers in the past year (date may be approximate).     Assessment:   This is a routine wellness examination for Yitta.  Hearing/Vision screen No results found.  Dietary issues and exercise activities discussed:     Goals Addressed   None   Depression Screen     No data to display          Fall Risk     No data to display          FALL RISK PREVENTION PERTAINING TO THE HOME:  Any stairs in or around the home? No  If so, are there any without handrails? No  Home free of loose throw rugs in walkways, pet beds, electrical cords, etc? Yes  Adequate lighting in your home to reduce risk of falls? Yes   ASSISTIVE DEVICES UTILIZED TO PREVENT FALLS:  Life alert? No  Use of a cane, walker or w/c? Yes  Grab bars in the bathroom? Yes  Shower chair or bench in shower? Yes  Elevated toilet seat or a handicapped toilet? Yes   TIMED UP AND GO:  Was the test performed? No .    Cognitive Function:        Immunizations Immunization History  Administered Date(s) Administered   Covid-19, Mrna,Vaccine(Spikevax)78yrs and older 11/13/2021   Influenza, High Dose Seasonal PF 09/27/2017, 08/22/2018, 10/11/2020, 10/21/2021   PFIZER Comirnaty(Gray Top)Covid-19 Tri-Sucrose Vaccine 01/17/2019, 02/07/2019, 10/14/2019   PPD Test 12/30/2020   Pfizer Covid-19 Vaccine Bivalent Booster 36yrs & up 10/11/2020   Pneumococcal Conjugate-13 02/23/2019   Pneumococcal Polysaccharide-23 03/26/2020   Zoster Recombinat (Shingrix) 02/23/2019    TDAP status: Due, Education  has been provided regarding the importance of this vaccine. Advised may receive this vaccine at local pharmacy or Health Dept. Aware to provide a copy of the vaccination record if obtained from local pharmacy or Health Dept. Verbalized acceptance and understanding.  Flu Vaccine status: Up to date  Pneumococcal vaccine status: Up to date  Covid-19 vaccine status: Information provided on how to obtain vaccines.   Qualifies for Shingles Vaccine? Yes   Zostavax  completed No   Shingrix Completed?: No.    Education has been provided regarding the importance of this vaccine. Patient has been advised to call insurance company to determine out of pocket expense if they have not yet received this vaccine. Advised may also receive vaccine at local pharmacy or Health Dept. Verbalized acceptance and understanding.  Screening Tests Health Maintenance  Topic Date Due   Hepatitis C Screening  Never done   DTaP/Tdap/Td (1 - Tdap) Never done   DEXA SCAN  Never done   Zoster Vaccines- Shingrix (2 of 2) 04/20/2019   Medicare Annual Wellness (AWV)  03/30/2023   Pneumonia Vaccine 41+ Years old  Completed   INFLUENZA VACCINE  Completed   COVID-19 Vaccine  Completed   HPV VACCINES  Aged Out   COLONOSCOPY (Pts 45-74yrs Insurance coverage will need to be confirmed)  Discontinued    Health Maintenance  Health Maintenance Due  Topic Date Due   Hepatitis C Screening  Never done   DTaP/Tdap/Td (1 - Tdap) Never done   DEXA SCAN  Never done   Zoster Vaccines- Shingrix (2 of 2) 04/20/2019    Colorectal cancer screening: No longer required.   Mammogram status: No longer required due to age.  Pt does not wish to take her out for bone density   Lung Cancer Screening: (Low Dose CT Chest recommended if Age 79-80 years, 30 pack-year currently smoking OR have quit w/in 15years.) does not qualify.   Lung Cancer Screening Referral: na  Additional Screening:  Hepatitis C Screening: does qualify; Complete with  next blood work.   Vision Screening: Recommended annual ophthalmology exams for early detection of glaucoma and other disorders of the eye. Is the patient up to date with their annual eye exam?  No  Who is the provider or what is the name of the office in which the patient attends annual eye exams? Unable to complete If pt is not established with a provider, would they like to be referred to a provider to establish care? No .   Dental Screening: Recommended annual dental exams for proper oral hygiene  Community Resource Referral / Chronic Care Management: CRR required this visit?  No   CCM required this visit?  No      Plan:     I have personally reviewed and noted the following in the patient's chart:   Medical and social history Use of alcohol, tobacco or illicit drugs  Current medications and supplements including opioid prescriptions. Patient is not currently taking opioid prescriptions. Functional ability and status Nutritional status Physical activity Advanced directives List of other physicians Hospitalizations, surgeries, and ER visits in previous 12 months Vitals Screenings to include cognitive, depression, and falls Referrals and appointments  In addition, I have reviewed and discussed with patient certain preventive protocols, quality metrics, and best practice recommendations. A written personalized care plan for preventive services as well as general preventive health recommendations were provided to patient.     Lauree Chandler, NP   03/30/2022   Place of service: [facility]

## 2022-03-31 LAB — HM DEXA SCAN

## 2022-04-14 ENCOUNTER — Non-Acute Institutional Stay: Payer: Medicare HMO | Admitting: Student

## 2022-04-14 ENCOUNTER — Encounter: Payer: Self-pay | Admitting: Student

## 2022-04-14 DIAGNOSIS — G301 Alzheimer's disease with late onset: Secondary | ICD-10-CM

## 2022-04-14 DIAGNOSIS — F02B3 Dementia in other diseases classified elsewhere, moderate, with mood disturbance: Secondary | ICD-10-CM | POA: Diagnosis not present

## 2022-04-14 DIAGNOSIS — F22 Delusional disorders: Secondary | ICD-10-CM | POA: Diagnosis not present

## 2022-04-14 NOTE — Progress Notes (Signed)
Location:  Other [facility] [place].  Nursing Home Room Number: [facility] 202A Place of Service:  ALF [phone]) Provider:  Earnestine Mealing, MD  Patient Care Team: Earnestine Mealing, MD as PCP - General [facility] Medicine)  Extended Emergency Contact Information Primary Emergency Contact: [place], [place] [address] [name] of [place] Home Phone: [phone] Mobile Phone: [phone] Relation: Son Secondary Emergency Contact: Eg[place], [place] [address] [name] of [place] Home Phone: [phone] Mobile Phone: [phone] Relation: Daughter  Code Status:  Full Code.  Goals of care: Advanced Directive information    04/14/2022    8:30 AM  Advanced Directives  Does Patient Have a Medical Advance Directive? No  Would patient like information on creating a medical advance directive? No - Patient declined     Chief Complaint  Patient presents with   Medical Management of Chronic Issues    HPI:  Pt is a 77 y.o. female seen today for medical management of chronic diseases.    Patient is alert and oriented to self only.   Nursing states patient is constantly asking for her mother. Gathers others around and tells them they are all going to meet her mother. She is not redirectable. She has been a danger to herself and others at these times.   Past Medical History:  Diagnosis Date   Arthritis    Cancer    Dementia    Hypertension    Retina disorder, right    Past Surgical History:  Procedure Laterality Date   ABDOMINAL HYSTERECTOMY     COLONOSCOPY N/A 10/10/2019   Procedure: COLONOSCOPY;  Surgeon: Toledo, Boykin Nearing, MD;  Location: [facility] ENDOSCOPY;  Service: Gastroenterology;  Laterality: N/A;   JOINT REPLACEMENT     TONSILLECTOMY      Allergies  Allergen Reactions   Erythromycin Rash    Outpatient Encounter Medications as of 04/14/2022  Medication Sig   aspirin 81 MG chewable tablet Chew 81 mg by mouth daily.   B Complex Vitamins  (VITAMIN B COMPLEX PO) Give 1 tablet by mouth one time a day for supplement   calcium carbonate (CALCIUM 600) 600 MG TABS tablet Take 600 mg by mouth 2 (two) times daily with a meal.   Cholecalciferol 25 MCG (1000 UT) tablet Take 1,000 Units by mouth daily.   losartan (COZAAR) 50 MG tablet Take 50 mg by mouth daily.   melatonin 5 MG TABS Take 5 mg by mouth at bedtime.   Multiple Vitamins-Minerals (MULTIVITAMIN ADULT EXTRA C PO) Take 1 tablet by mouth daily.   nystatin (MYCOSTATIN/NYSTOP) powder Apply 1 Application topically 2 (two) times daily. Under left breast for skin breakdown   QUEtiapine (SEROQUEL) 25 MG tablet Take 25 mg by mouth daily. Give 1/2 tablet by mouth twice daily at 1pm and 8pm to manage behaviors,   traZODone (DESYREL) 100 MG tablet Take 100 mg by mouth at bedtime.   No facility-administered encounter medications on file as of 04/14/2022.    Review of Systems  Immunization History  Administered Date(s) Administered   Covid-19, Mrna,Vaccine(Spikevax)62yrs and older 11/13/2021   Influenza, High Dose Seasonal PF 09/27/2017, 08/22/2018, 10/11/2020, 10/21/2021   Moderna Covid-19 Vaccine Bivalent Booster 11yrs & up 04/13/2022   PFIZER Comirnaty(Gray Top)Covid-19 Tri-Sucrose Vaccine 01/17/2019, 02/07/2019, 10/14/2019   PPD Test 12/30/2020   Pfizer Covid-19 Vaccine Bivalent Booster 5yrs & up 10/11/2020   Pneumococcal Conjugate-13 02/23/2019   Pneumococcal Polysaccharide-23 03/26/2020  Tdap 04/01/2022   Zoster Recombinat (Shingrix) 02/23/2019   Pertinent  Health Maintenance Due  Topic Date Due   INFLUENZA VACCINE  08/05/2022   DEXA SCAN  Completed   COLONOSCOPY (Pts 45-2243yrs Insurance coverage will need to be confirmed)  Discontinued      10/10/2019   10:54 AM  Fall Risk  (RETIRED) Patient Fall Risk Level Moderate fall risk   Functional Status Survey:    Vitals:   04/14/22 0808  BP: 130/73  Pulse: 66  Resp: 15  Temp: (!) 97.1 F (36.2 C)  SpO2: 97%  Weight:  234 lb (106.1 kg)  Height: 5\' 4"  (1.626 m)   Body mass index is 40.17 kg/m. Physical Exam Constitutional:      Appearance: She is obese.  Cardiovascular:     Rate and Rhythm: Normal rate.     Pulses: Normal pulses.  Pulmonary:     Effort: Pulmonary effort is normal.  Neurological:     Mental Status: She is alert. Mental status is at baseline. She is disoriented.     Labs reviewed: Recent Labs    06/22/21 0000 11/19/21 0000  NA 140 138  K 4.2 4.6  CL 106 105  CO2 30* 30*  BUN 17 21  CREATININE 0.9 0.9  CALCIUM 9.3 9.2   Recent Labs    06/22/21 0000  AST 13  ALT 11  ALKPHOS 63  ALBUMIN 3.7   Recent Labs    06/22/21 0000  WBC 4.9  HGB 12.1  HCT 36  PLT 253   No results found for: "TSH" No results found for: "HGBA1C" No results found for: "CHOL", "HDL", "LDLCALC", "LDLDIRECT", "TRIG", "CHOLHDL"  Significant Diagnostic Results in last 30 days:  No results found.  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 Will plan to adjust timing of seroquel -- 12.5 mg at noon and 12.5 mg at night. If no improvement of symptoms, will consider starting brexipiprazole vs other antipsychotic that can aid with patient's disturbing delusions.    Family/ staff Communication: nursing  Labs/tests ordered:  none

## 2022-05-06 LAB — CBC AND DIFFERENTIAL
HCT: 34 — AB (ref 36–46)
Hemoglobin: 11.2 — AB (ref 12.0–16.0)
Neutrophils Absolute: 2810
Platelets: 280 10*3/uL (ref 150–400)
WBC: 5

## 2022-05-06 LAB — BASIC METABOLIC PANEL
BUN: 23 — AB (ref 4–21)
CO2: 30 — AB (ref 13–22)
Chloride: 103 (ref 99–108)
Creatinine: 1 (ref 0.5–1.1)
Glucose: 92
Potassium: 4.3 mEq/L (ref 3.5–5.1)
Sodium: 139 (ref 137–147)

## 2022-05-06 LAB — COMPREHENSIVE METABOLIC PANEL
Calcium: 9.7 (ref 8.7–10.7)
eGFR: 61

## 2022-05-06 LAB — CBC: RBC: 3.59 — AB (ref 3.87–5.11)

## 2022-05-24 ENCOUNTER — Non-Acute Institutional Stay: Payer: Medicare HMO | Admitting: Student

## 2022-05-24 ENCOUNTER — Encounter: Payer: Self-pay | Admitting: Student

## 2022-05-24 DIAGNOSIS — F0280 Dementia in other diseases classified elsewhere without behavioral disturbance: Secondary | ICD-10-CM

## 2022-05-24 DIAGNOSIS — R600 Localized edema: Secondary | ICD-10-CM

## 2022-05-24 DIAGNOSIS — Z6841 Body Mass Index (BMI) 40.0 and over, adult: Secondary | ICD-10-CM | POA: Diagnosis not present

## 2022-05-24 DIAGNOSIS — H348312 Tributary (branch) retinal vein occlusion, right eye, stable: Secondary | ICD-10-CM

## 2022-05-24 DIAGNOSIS — F22 Delusional disorders: Secondary | ICD-10-CM | POA: Diagnosis not present

## 2022-05-24 DIAGNOSIS — G309 Alzheimer's disease, unspecified: Secondary | ICD-10-CM | POA: Diagnosis not present

## 2022-05-24 DIAGNOSIS — I10 Essential (primary) hypertension: Secondary | ICD-10-CM

## 2022-05-24 NOTE — Progress Notes (Signed)
Location:  Other [facility].  Nursing Home Room Number: [facility] [address] Place of Service:  ALF [phone]) Provider:  Earnestine Mealing, MD  Patient Care Team: Earnestine Mealing, MD as PCP - General [facility] Medicine)  Extended Emergency Contact Information Primary Emergency Contact: [place], [place] [address] [name] of [place] Home Phone: [phone] Mobile Phone: [phone] Relation: Son Secondary Emergency Contact: [place], [place] [address] [name] of [place] Home Phone: [phone] Mobile Phone: [phone] Relation: Daughter  Code Status:  Full Code.  Goals of care: Advanced Directive information    05/24/2022    9:20 AM  Advanced Directives  Does Patient Have a Medical Advance Directive? No  Does patient want to make changes to medical advance directive? No - Patient declined     Chief Complaint  Patient presents with   Medical Management of Chronic Issues    Medical Management of Chronic Issues.     HPI:  Pt is a 77 y.o. female seen today for medical management of chronic diseases.    She is alert. She has a book on her lap. When asked about the book she says You know it's about, uh. This, and points to words on the back of the book.  Continues to have severe delusions. Nursing states she is adamantly looking for her mother. She gets everyone together and says they need to go find her mom who is going to take them somewhere. She is not redirectable when these episodes occur. Recent medication changes have helped.   Past Medical History:  Diagnosis Date   Arthritis    Cancer (HCC)    Dementia (HCC)    Hypertension    Retina disorder, right    Past Surgical History:  Procedure Laterality Date   ABDOMINAL HYSTERECTOMY     COLONOSCOPY N/A 10/10/2019   Procedure: COLONOSCOPY;  Surgeon: Toledo, Boykin Nearing, MD;  Location: [facility] ENDOSCOPY;  Service: Gastroenterology;  Laterality: N/A;   JOINT REPLACEMENT     TONSILLECTOMY       Allergies  Allergen Reactions   Erythromycin Rash    Outpatient Encounter Medications as of 05/24/2022  Medication Sig   acetaminophen (TYLENOL) 325 MG tablet Take 650 mg by mouth every 4 (four) hours as needed.   aspirin 81 MG chewable tablet Chew 81 mg by mouth daily.   B Complex Vitamins (VITAMIN B COMPLEX PO) Give 1 tablet by mouth one time a day for supplement   calcium carbonate (CALCIUM 600) 600 MG TABS tablet Take 600 mg by mouth 2 (two) times daily with a meal.   Cholecalciferol 25 MCG (1000 UT) tablet Take 1,000 Units by mouth daily.   losartan (COZAAR) 50 MG tablet Take 50 mg by mouth daily.   melatonin 5 MG TABS Take 5 mg by mouth at bedtime.   Multiple Vitamins-Minerals (MULTIVITAMIN ADULT EXTRA C PO) Take 1 tablet by mouth daily.   nystatin (MYCOSTATIN/NYSTOP) powder Apply 1 Application topically 2 (two) times daily. Under left breast for skin breakdown   polyethylene glycol (MIRALAX / GLYCOLAX) 17 g packet Take 17 g by mouth daily as needed.   QUEtiapine (SEROQUEL) 25 MG tablet Give 1/2 tablet by mouth twice daily at 1pm and 8pm to manage behaviors,   traZODone (DESYREL) 100 MG tablet Take 100 mg by mouth at bedtime.   No facility-administered encounter medications on file as of 05/24/2022.    Review of Systems  Immunization History  Administered Date(s) Administered   Covid-19, Mrna,Vaccine(Spikevax)23yrs and older 11/13/2021   Influenza, High Dose Seasonal PF 09/27/2017, 08/22/2018, 10/11/2020, 10/21/2021   Moderna Covid-19 Vaccine Bivalent Booster 44yrs & up 04/13/2022   PFIZER Comirnaty(Gray Top)Covid-19 Tri-Sucrose Vaccine 01/17/2019, 02/07/2019, 10/14/2019   PPD Test 12/30/2020   Pfizer Covid-19 Vaccine Bivalent Booster 69yrs & up 10/11/2020   Pneumococcal Conjugate-13 02/23/2019   Pneumococcal Polysaccharide-23 03/26/2020   Tdap 04/01/2022   Zoster Recombinat (Shingrix) 02/23/2019, 04/26/2022   Pertinent  Health Maintenance Due  Topic Date Due    INFLUENZA VACCINE  08/05/2022   DEXA SCAN  Completed   COLONOSCOPY (Pts 45-31yrs Insurance coverage will need to be confirmed)  Discontinued      10/10/2019   10:54 AM  Fall Risk  (RETIRED) Patient Fall Risk Level Moderate fall risk   Functional Status Survey:    Vitals:   05/24/22 0901  BP: 106/69  Pulse: 64  Resp: 16  Temp: (!) 96.5 F (35.8 C)  SpO2: 93%  Weight: 228 lb (103.4 kg)  Height: 5\' 4"  (1.626 m)   Body mass index is 39.14 kg/m. Physical Exam Constitutional:      Appearance: She is obese.  Cardiovascular:     Rate and Rhythm: Normal rate and regular rhythm.     Pulses: Normal pulses.     Heart sounds: Normal heart sounds.  Pulmonary:     Effort: Pulmonary effort is normal.     Breath sounds: Normal breath sounds.  Abdominal:     General: Abdomen is flat.  Neurological:     Mental Status: She is alert. Mental status is at baseline. She is disoriented.     Labs reviewed: Recent Labs    06/22/21 0000 11/19/21 0000 05/06/22 0000  NA 140 138 139  K 4.2 4.6 4.3  CL 106 105 103  CO2 30* 30* 30*  BUN 17 21 23*  CREATININE 0.9 0.9 1.0  CALCIUM 9.3 9.2 9.7   Recent Labs    06/22/21 0000  AST 13  ALT 11  ALKPHOS 63  ALBUMIN 3.7   Recent Labs    06/22/21 0000 05/06/22 0000  WBC 4.9 5.0  NEUTROABS  --  2,810.00  HGB 12.1 11.2*  HCT 36 34*  PLT 253 280   No results found for: "TSH" No results found for: "HGBA1C" No results found for: "CHOL", "HDL", "LDLCALC", "LDLDIRECT", "TRIG", "CHOLHDL"  Significant Diagnostic Results in last 30 days:  No results found.  Assessment/Plan 1. Alzheimer disease (HCC) 2. Delusional disorders (HCC) Symptoms improved with adding 12.5 seroquel during the day. Continue nightly trazodone.   3. BMI 45.0-49.9, adult (HCC) Encourage physical activity and health ydiet.   4. 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 Continue ASA.   5. Hypertension Well-controlled with  losartan 50 mg daily. Continue ASA daily     Family/ staff Communication: nursing  Labs/tests ordered:  none

## 2022-05-30 ENCOUNTER — Encounter: Payer: Self-pay | Admitting: Student

## 2022-07-09 ENCOUNTER — Non-Acute Institutional Stay: Payer: Medicare HMO | Admitting: Student

## 2022-07-09 ENCOUNTER — Encounter: Payer: Self-pay | Admitting: Student

## 2022-07-09 DIAGNOSIS — F0280 Dementia in other diseases classified elsewhere without behavioral disturbance: Secondary | ICD-10-CM

## 2022-07-09 DIAGNOSIS — I10 Essential (primary) hypertension: Secondary | ICD-10-CM | POA: Diagnosis not present

## 2022-07-09 DIAGNOSIS — R634 Abnormal weight loss: Secondary | ICD-10-CM | POA: Diagnosis not present

## 2022-07-09 DIAGNOSIS — F22 Delusional disorders: Secondary | ICD-10-CM | POA: Diagnosis not present

## 2022-07-09 DIAGNOSIS — G309 Alzheimer's disease, unspecified: Secondary | ICD-10-CM

## 2022-07-09 NOTE — Progress Notes (Unsigned)
[facility]:  [facility] Room Number: 202 A Place of Service:  ALF [phone]) Provider:  Earnestine Mealing, MD  Patient Care Team: Earnestine Mealing, MD as PCP - General (Family Medicine)  Extended Emergency Contact Information Primary Emergency Contact: [place], [place] [address] [name] of [place] Home Phone: [phone] Mobile Phone: [phone] Relation: Son Secondary Emergency Contact: Egbert Gar[place], [place] [address] [name] of [place] Home Phone: [phone] Mobile Phone: [phone] Relation: Daughter  Code Status:  Full Code  Goals of care: Advanced Directive information    07/09/2022    8:50 AM  Advanced Directives  Does Patient Have a Medical Advance Directive? No  Does patient want to make changes to medical advance directive? No - Patient declined     Chief Complaint  Patient presents with   Medical Management of Chronic Issues    Routine visit. Discuss need for Hep C screening. Discuss recent fall and toenail discoloration.     HPI:  Pt is a 77 y.o. female seen today for medical management of chronic diseases.    Patient states she hopes she is doing well. Nursing states she continues to have issues of being redirectable. She looks for her mother and disturbs other patients trying bring them together to find her mom and go to her.   Medications changes have been helpful, but not all of the time.   She has a black toenail.   Patient had a fall, unwitnessed. She doesn't remember, she states, that's what they tell me. But she denies pain at this time.   Of note, patient has lost almost 10 lbs since April 2024  Past Medical History:  Diagnosis Date   Arthritis    Cancer (HCC)    Dementia (HCC)    Hypertension    Retina disorder, right    Past Surgical History:  Procedure Laterality Date   ABDOMINAL HYSTERECTOMY     COLONOSCOPY N/A 10/10/2019   Procedure: COLONOSCOPY;  Surgeon: Toledo, Boykin Nearing, MD;   Location: [facility]OSCOPY;  Service: Gastroenterology;  Laterality: N/A;   JOINT REPLACEMENT     TONSILLECTOMY      Allergies  Allergen Reactions   Erythromycin Rash    Outpatient Encounter Medications as of 07/09/2022  Medication Sig   acetaminophen (TYLENOL) 325 MG tablet Take 650 mg by mouth every 4 (four) hours as needed.   aspirin 81 MG chewable tablet Chew 81 mg by mouth daily.   B Complex Vitamins (VITAMIN B COMPLEX PO) Give 1 tablet by mouth one time a day for supplement   busPIRone (BUSPAR) 5 MG tablet Take 5 mg by mouth 2 (two) times daily.   calcium carbonate (CALCIUM 600) 600 MG TABS tablet Take 600 mg by mouth 2 (two) times daily with a meal.   Cholecalciferol 25 MCG (1000 UT) tablet Take 1,000 Units by mouth daily.   losartan (COZAAR) 50 MG tablet Take 50 mg by mouth daily.   melatonin 5 MG TABS Take 5 mg by mouth at bedtime.   Multiple Vitamins-Minerals (MULTIVITAMIN ADULT EXTRA C PO) Take 1 tablet by mouth daily.   nystatin (MYCOSTATIN/NYSTOP) powder Apply 1 Application topically 2 (two) times daily. Under left breast for skin breakdown   polyethylene glycol (MIRALAX / GLYCOLAX) 17 g packet Take 17 g by mouth daily as needed.   QUEtiapine (SEROQUEL) 25 MG tablet Give 1.5 tablet by mouth in the afternoon  for administer 37.5mg , 1.5 tablet at 1pm daily See other listing also   QUEtiapine (SEROQUEL) 25 MG tablet Take 25 mg by mouth every evening. 8 pm, see other listing also   traZODone (DESYREL) 100 MG tablet Take 100 mg by mouth at bedtime.   No facility-administered encounter medications on file as of 07/09/2022.    Review of Systems  Immunization History  Administered Date(s) Administered   Covid-19, Mrna,Vaccine(Spikevax)86yrs and older 11/13/2021   Influenza, High Dose Seasonal PF 09/27/2017, 08/22/2018, 10/11/2020, 10/21/2021   Moderna Covid-19 Vaccine Bivalent Booster 81yrs & up 04/13/2022   PFIZER Comirnaty(Gray Top)Covid-19 Tri-Sucrose Vaccine 01/17/2019,  02/07/2019, 10/14/2019   PPD Test 12/30/2020   Pfizer Covid-19 Vaccine Bivalent Booster 85yrs & up 10/11/2020   Pneumococcal Conjugate-13 02/23/2019   Pneumococcal Polysaccharide-23 03/26/2020   Tdap 04/01/2022   Zoster Recombinant(Shingrix) 02/23/2019, 04/26/2022   Pertinent  Health Maintenance Due  Topic Date Due   INFLUENZA VACCINE  08/05/2022   DEXA SCAN  Completed   Colonoscopy  Discontinued      10/10/2019   10:54 AM  Fall Risk  (RETIRED) Patient Fall Risk Level Moderate fall risk   Functional Status Survey:    Vitals:   07/09/22 0849  BP: 132/75  Pulse: 67  Temp: 98.4 F (36.9 C)  Weight: 226 lb (102.5 kg)  Height: 5\' 4"  (1.626 m)   Body mass index is 38.79 kg/m. Physical Exam Vitals reviewed.  Cardiovascular:     Rate and Rhythm: Normal rate.     Pulses: Normal pulses.  Pulmonary:     Effort: Pulmonary effort is normal.  Abdominal:     General: Abdomen is flat.     Palpations: Abdomen is soft.  Skin:    Comments: Left second toenail black in color  Neurological:     Mental Status: She is alert. Mental status is at baseline. She is disoriented.     Labs reviewed: Recent Labs    11/19/21 0000 05/06/22 0000  NA 138 139  K 4.6 4.3  CL 105 103  CO2 30* 30*  BUN 21 23*  CREATININE 0.9 1.0  CALCIUM 9.2 9.7   No results for input(s): "AST", "ALT", "ALKPHOS", "BILITOT", "PROT", "ALBUMIN" in the last 8760 hours. Recent Labs    05/06/22 0000  WBC 5.0  NEUTROABS 2,810.00  HGB 11.2*  HCT 34*  PLT 280   No results found for: "TSH" No results found for: "HGBA1C" No results found for: "CHOL", "HDL", "LDLCALC", "LDLDIRECT", "TRIG", "CHOLHDL"  Significant Diagnostic Results in last 30 days:  No results found.  Assessment/Plan Alzheimer disease (HCC)  Delusional disorders (HCC)  Primary hypertension  Weight loss Patient has had progression of dementia with increased confusion, more delusions and lack of improvement with medications.  Increase Seroquel 37.5 mg nightly and 25 mg daily during the day to help with delusions. Buspar has helped with anxiety as well. Continue trazodone 100 mg nightly. Discussed decreasing trazodone given increased dose of seroquel, will make adjustments based on how patient's sleep changes.   Family/ staff Communication: nursing I Labs/tests ordered:  none

## 2022-07-13 ENCOUNTER — Encounter: Payer: Self-pay | Admitting: Student

## 2022-07-19 ENCOUNTER — Non-Acute Institutional Stay: Payer: Self-pay | Admitting: Student

## 2022-07-19 ENCOUNTER — Encounter: Payer: Self-pay | Admitting: Student

## 2022-07-19 DIAGNOSIS — I10 Essential (primary) hypertension: Secondary | ICD-10-CM

## 2022-07-19 DIAGNOSIS — F22 Delusional disorders: Secondary | ICD-10-CM | POA: Diagnosis not present

## 2022-07-19 DIAGNOSIS — G309 Alzheimer's disease, unspecified: Secondary | ICD-10-CM | POA: Diagnosis not present

## 2022-07-19 DIAGNOSIS — R296 Repeated falls: Secondary | ICD-10-CM | POA: Insufficient documentation

## 2022-07-19 DIAGNOSIS — F0280 Dementia in other diseases classified elsewhere without behavioral disturbance: Secondary | ICD-10-CM

## 2022-07-19 NOTE — Progress Notes (Signed)
Location:  Other [facility].  Nursing Home Room Number: [facility] 202A Place of Service:  ALF [phone]) Provider:  Earnestine Mealing, MD  Patient Care Team: Earnestine Mealing, MD as PCP - General [facility] Medicine)  Extended Emergency Contact Information Primary Emergency Contact: [place], [place] [address] [name] of [place] Home Phone: [phone] Mobile Phone: [phone] Relation: Son Secondary Emergency Contact: Egbert Garibal[place], [place] [address] [name] of [place] Home Phone: [phone] Mobile Phone: [phone] Relation: Daughter  Code Status:  DNR Goals of care: Advanced Directive information    07/19/2022    9:38 AM  Advanced Directives  Does Patient Have a Medical Advance Directive? No  Does patient want to make changes to medical advance directive? No - Patient declined     Chief Complaint  Patient presents with   Acute Visit    Congestion.     HPI:  Pt is a 77 y.o. female seen today for an acute visit for Congestion and evaluation after a fall.   Patient says, "No my chest doesn't hurt, no I do not have congestion, should I? I don't remember falling, did I?   Spoke with patient's son regarding patient's status. She was diagnosed and last July decided to have her come to memory care facility. She was in home with aids in the evenings due to sundowning about 1.5 years. They were starting to get concerned about the time in between. A lot of stress. At the time they thought they were ahead of it, at that time. Discussed concern that she has had numerous falls and continues to have decline. Educated regarding the phases of dementia and the length of time before people expire.   Mobility has become an issue as well. 6 months ago he stopped feeling comfortable with taking her ot church weekly and needing to use the restroom.   Spoke with Daughter, Jill [facility]),  who agrees that patient should have a DNR.    Past Medical History:   Diagnosis Date   Arthritis    Cancer (HCC)    Dementia (HCC)    Hypertension    Retina disorder, right    Past Surgical History:  Procedure Laterality Date   ABDOMINAL HYSTERECTOMY     COLONOSCOPY N/A 10/10/2019   Procedure: COLONOSCOPY;  Surgeon: Toledo, Boykin Nearing, MD;  Location: [facility] ENDOSCOPY;  Service: Gastroenterology;  Laterality: N/A;   JOINT REPLACEMENT     TONSILLECTOMY      Allergies  Allergen Reactions   Erythromycin Rash    Outpatient Encounter Medications as of 07/19/2022  Medication Sig   acetaminophen (TYLENOL) 325 MG tablet Take 650 mg by mouth every 4 (four) hours as needed.   aspirin 81 MG chewable tablet Chew 81 mg by mouth daily.   B Complex Vitamins (VITAMIN B COMPLEX PO) Give 1 tablet by mouth one time a day for supplement   busPIRone (BUSPAR) 5 MG tablet Take 5 mg by mouth 2 (two) times daily.   calcium carbonate (CALCIUM 600) 600 MG TABS tablet Take 600 mg by mouth 2 (two) times daily with a meal.   Cholecalciferol 25 MCG (1000 UT) tablet Take 1,000 Units by mouth daily.   guaiFENesin (ROBITUSSIN) 100 MG/5ML liquid Take 30 mLs by mouth every 4 (four) hours as needed for cough or to loosen phlegm.   losartan (COZAAR) 50 MG tablet Take 50 mg by mouth daily.   melatonin  5 MG TABS Take 5 mg by mouth at bedtime.   Multiple Vitamins-Minerals (MULTIVITAMIN ADULT EXTRA C PO) Take 1 tablet by mouth daily.   nystatin (MYCOSTATIN/NYSTOP) powder Apply 1 Application topically 2 (two) times daily. Under left breast for skin breakdown   polyethylene glycol (MIRALAX / GLYCOLAX) 17 g packet Take 17 g by mouth daily as needed.   QUEtiapine (SEROQUEL) 25 MG tablet Give 1.5 tablet by mouth in the afternoon for administer 37.5mg , 1.5 tablet at 1pm daily See other listing also   QUEtiapine (SEROQUEL) 25 MG tablet Take 25 mg by mouth every evening. 8 pm, see other listing also   traZODone (DESYREL) 100 MG tablet Take 50 mg by mouth at bedtime.   No facility-administered  encounter medications on file as of 07/19/2022.    Review of Systems  Immunization History  Administered Date(s) Administered   Covid-19, Mrna,Vaccine(Spikevax)74yrs and older 11/13/2021   Influenza, High Dose Seasonal PF 09/27/2017, 08/22/2018, 10/11/2020, 10/21/2021   Moderna Covid-19 Vaccine Bivalent Booster 5yrs & up 04/13/2022   PFIZER Comirnaty(Gray Top)Covid-19 Tri-Sucrose Vaccine 01/17/2019, 02/07/2019, 10/14/2019   PPD Test 12/30/2020   Pfizer Covid-19 Vaccine Bivalent Booster 24yrs & up 10/11/2020   Pneumococcal Conjugate-13 02/23/2019   Pneumococcal Polysaccharide-23 03/26/2020   Tdap 04/01/2022   Zoster Recombinant(Shingrix) 02/23/2019, 04/26/2022   Pertinent  Health Maintenance Due  Topic Date Due   INFLUENZA VACCINE  08/05/2022   DEXA SCAN  Completed   Colonoscopy  Discontinued      10/10/2019   10:54 AM  Fall Risk  (RETIRED) Patient Fall Risk Level Moderate fall risk   Functional Status Survey:    Vitals:   07/19/22 0932 07/19/22 0940  BP: (!) 144/78 113/73  Pulse: 66   Resp: 16   Temp: (!) 97.1 F (36.2 C)   SpO2: 97%   Weight: 227 lb (103 kg)   Height: 5\' 4"  (1.626 m)    Body mass index is 38.96 kg/m. Physical Exam Constitutional:      Appearance: Normal appearance.  Cardiovascular:     Rate and Rhythm: Normal rate and regular rhythm.  Pulmonary:     Effort: Pulmonary effort is normal.     Breath sounds: Normal breath sounds.  Abdominal:     General: Abdomen is flat.     Palpations: Abdomen is soft.  Musculoskeletal:        General: Normal range of motion.     Comments: 4/5 bilateral upper, lower extremity strength 4/5 grip stregth  Neurological:     Mental Status: She is alert and oriented to person, place, and time.     Labs reviewed: Recent Labs    11/19/21 0000 05/06/22 0000  NA 138 139  K 4.6 4.3  CL 105 103  CO2 30* 30*  BUN 21 23*  CREATININE 0.9 1.0  CALCIUM 9.2 9.7   No results for input(s): "AST", "ALT",  "ALKPHOS", "BILITOT", "PROT", "ALBUMIN" in the last 8760 hours. Recent Labs    05/06/22 0000  WBC 5.0  NEUTROABS 2,810.00  HGB 11.2*  HCT 34*  PLT 280   No results found for: "TSH" No results found for: "HGBA1C" No results found for: "CHOL", "HDL", "LDLCALC", "LDLDIRECT", "TRIG", "CHOLHDL"  Significant Diagnostic Results in last 30 days:  No results found.  Assessment/Plan Alzheimer disease (HCC) - Plan: Do not attempt resuscitation (DNR)  Delusional disorders (HCC)  Primary hypertension  Recurrent falls Patient has shown signs of progression of dementia. FAST Stage 6d. Discussed concern of her notable decline  in the last year. Family agrees. Decided to change her to DNR. Advised family that we can have a family meeting in upcoming months if she shows signs of continued decline. They do not want to perform life-prolonging measures at this time. Delusions ocntinue to increase, continue medication titration as tolerated. BP well-controlled. PT/OT eval and treat.   Family/ staff Communication: son, daughter, nursing  Labs/tests ordered:  none

## 2022-08-17 ENCOUNTER — Encounter: Payer: Self-pay | Admitting: Nurse Practitioner

## 2022-08-17 ENCOUNTER — Non-Acute Institutional Stay: Payer: Medicare HMO | Admitting: Nurse Practitioner

## 2022-08-17 DIAGNOSIS — F03918 Unspecified dementia, unspecified severity, with other behavioral disturbance: Secondary | ICD-10-CM

## 2022-08-17 DIAGNOSIS — R600 Localized edema: Secondary | ICD-10-CM | POA: Diagnosis not present

## 2022-08-17 DIAGNOSIS — G4709 Other insomnia: Secondary | ICD-10-CM

## 2022-08-17 DIAGNOSIS — I10 Essential (primary) hypertension: Secondary | ICD-10-CM | POA: Diagnosis not present

## 2022-08-17 NOTE — Progress Notes (Signed)
Location:  Other [facility].  Nursing Home Room Number: [facility] 202A Place of Service:  ALF [phone]) [name], NP  PCP: Earnestine Mealing, MD  Patient Care Team: Earnestine Mealing, MD as PCP - General [facility] Medicine)  Extended Emergency Contact Information Primary Emergency Contact: [place], [place] [address] Darden Small of [place] Home Phone: [phone] Mobile Phone: [phone] Relation: Son Secondary Emergency Contact: [place], [place] [address] Darden Small of [place] Home Phone: [phone] Mobile Phone: [phone] Relation: Daughter  Goals of care: Advanced Directive information    08/17/2022    9:39 AM  Advanced Directives  Does Patient Have a Medical Advance Directive? Yes  Type of Advance Directive Out of facility DNR (pink MOST or yellow form)  Does patient want to make changes to medical advance directive? No - Patient declined     Chief Complaint  Patient presents with   Medical Management of Chronic Issues    Medical Management of Chronic Issues.     HPI:  Pt is a 76 y.o. female seen today for medical management of chronic disease.  Staff reports she is having more agitation and being combative in the afternoon when she comes back from activities.  Resisting care in the afternoon.   Sleeping well at night.   Blood pressure well controlled.   No pain.   Bowels moving well on mirlalax.   Usually eats well.   Had 2 falls in a week last month but none since, using walker now which has helped.    Past Medical History:  Diagnosis Date   Arthritis    Cancer (HCC)    Dementia (HCC)    Hypertension    Retina disorder, right    Past Surgical History:  Procedure Laterality Date   ABDOMINAL HYSTERECTOMY     COLONOSCOPY N/A 10/10/2019   Procedure: COLONOSCOPY;  Surgeon: Toledo, Boykin Nearing, MD;  Location: [facility] ENDOSCOPY;  Service: Gastroenterology;  Laterality: N/A;   JOINT REPLACEMENT     TONSILLECTOMY       Allergies  Allergen Reactions   Erythromycin Rash    Outpatient Encounter Medications as of 08/17/2022  Medication Sig   acetaminophen (TYLENOL) 325 MG tablet Take 650 mg by mouth every 4 (four) hours as needed.   aspirin 81 MG chewable tablet Chew 81 mg by mouth daily.   B Complex Vitamins (VITAMIN B COMPLEX PO) Give 1 tablet by mouth one time a day for supplement   busPIRone (BUSPAR) 5 MG tablet Take 5 mg by mouth 2 (two) times daily.   calcium carbonate (CALCIUM 600) 600 MG TABS tablet Take 600 mg by mouth 2 (two) times daily with a meal.   Cholecalciferol 25 MCG (1000 UT) tablet Take 1,000 Units by mouth daily.   guaiFENesin (ROBITUSSIN) 100 MG/5ML liquid Take 30 mLs by mouth every 4 (four) hours as needed for cough or to loosen phlegm.   losartan (COZAAR) 50 MG tablet Take 50 mg by mouth daily.   melatonin 5 MG TABS Take 5 mg by mouth at bedtime.   Multiple Vitamins-Minerals (MULTIVITAMIN ADULT EXTRA C PO) Take 1 tablet by mouth daily.   nystatin (MYCOSTATIN/NYSTOP) powder Apply 1 Application topically 2 (two) times daily. Under left breast for skin breakdown   polyethylene glycol (MIRALAX / GLYCOLAX) 17 g packet Take 17 g by mouth daily as needed.   QUEtiapine (SEROQUEL) 25 MG tablet Take 25 mg by  mouth daily as needed. Give one tablet by mouth in the afternoon at 1pm. Give TWO tablets by mouth at bedtime.   traZODone (DESYREL) 100 MG tablet Take 50 mg by mouth at bedtime.   [DISCONTINUED] QUEtiapine (SEROQUEL) 25 MG tablet Give 1.5 tablet by mouth in the afternoon for administer 37.5mg , 1.5 tablet at 1pm daily See other listing also   No facility-administered encounter medications on file as of 08/17/2022.    Review of Systems  Unable to perform ROS: Dementia     Immunization History  Administered Date(s) Administered   Covid-19, Mrna,Vaccine(Spikevax)61yrs and older 11/13/2021   Influenza, High Dose Seasonal PF 09/27/2017, 08/22/2018, 10/11/2020, 10/21/2021   Moderna  Covid-19 Vaccine Bivalent Booster 39yrs & up 04/13/2022   PFIZER Comirnaty(Gray Top)Covid-19 Tri-Sucrose Vaccine 01/17/2019, 02/07/2019, 10/14/2019   PPD Test 12/30/2020   Pfizer Covid-19 Vaccine Bivalent Booster 85yrs & up 10/11/2020   Pneumococcal Conjugate-13 02/23/2019   Pneumococcal Polysaccharide-23 03/26/2020   Tdap 04/01/2022   Zoster Recombinant(Shingrix) 02/23/2019, 04/26/2022   Pertinent  Health Maintenance Due  Topic Date Due   INFLUENZA VACCINE  08/05/2022   DEXA SCAN  Completed   Colonoscopy  Discontinued      10/10/2019   10:54 AM 07/19/2022    3:02 PM  Fall Risk  Falls in the past year?  1  Was there an injury with Fall?  0  Fall Risk Category Calculator  2  (RETIRED) Patient Fall Risk Level Moderate fall risk   Patient at Risk for Falls Due to  History of fall(s)   Functional Status Survey:    Vitals:   08/17/22 0932  BP: 117/71  Pulse: (!) 101  Resp: 19  Temp: 98.1 F (36.7 C)  SpO2: 96%  Weight: 220 lb 9.6 oz (100.1 kg)  Height: 5\' 4"  (1.626 m)   Body mass index is 37.87 kg/m. Physical Exam Constitutional:      General: She is not in acute distress.    Appearance: She is well-developed. She is not diaphoretic.  HENT:     Head: Normocephalic and atraumatic.     Mouth/Throat:     Pharynx: No oropharyngeal exudate.  Eyes:     Conjunctiva/sclera: Conjunctivae normal.     Pupils: Pupils are equal, round, and reactive to light.  Cardiovascular:     Rate and Rhythm: Normal rate and regular rhythm.     Heart sounds: Normal heart sounds.  Pulmonary:     Effort: Pulmonary effort is normal.     Breath sounds: Normal breath sounds.  Abdominal:     General: Bowel sounds are normal.     Palpations: Abdomen is soft.  Musculoskeletal:     Cervical back: Normal range of motion and neck supple.     Right lower leg: No edema.     Left lower leg: No edema.  Skin:    General: Skin is warm and dry.  Neurological:     Mental Status: She is alert.   Psychiatric:        Mood and Affect: Mood normal.     Labs reviewed: Recent Labs    11/19/21 0000 05/06/22 0000  NA 138 139  K 4.6 4.3  CL 105 103  CO2 30* 30*  BUN 21 23*  CREATININE 0.9 1.0  CALCIUM 9.2 9.7   No results for input(s): "AST", "ALT", "ALKPHOS", "BILITOT", "PROT", "ALBUMIN" in the last 8760 hours. Recent Labs    05/06/22 0000  WBC 5.0  NEUTROABS 2,810.00  HGB 11.2*  HCT  34*  PLT 280   No results found for: "TSH" No results found for: "HGBA1C" No results found for: "CHOL", "HDL", "LDLCALC", "LDLDIRECT", "TRIG", "CHOLHDL"  Significant Diagnostic Results in last 30 days:  No results found.  Assessment/Plan 1. Primary hypertension -Blood pressure well controlled, goal bp <140/90 Continue current medications and dietary modifications follow metabolic panel  2. Morbid obesity (HCC) Gradually losing weight due to progression of dementia, suspect weight will continue to decline   3. Alzheimer disease (HCC) with behaviors -Stable, no acute changes in cognitive or functional status, continue supportive care.  -will increase seroquel to 37.5 mg at 1 pm to help with afternoon agitation and behaviors.   4. Other insomnia -continues on trazodone   5. Bilateral leg edema -encouraged to elevate legs above level of heart as tolerates, low sodium diet, compression hose as tolerates (on in am, off in pm)    Roux Brandy K. Biagio Borg [facility] [phone]

## 2022-11-15 ENCOUNTER — Encounter: Payer: Self-pay | Admitting: Adult Health

## 2022-11-15 ENCOUNTER — Non-Acute Institutional Stay: Payer: Medicare HMO | Admitting: Adult Health

## 2022-11-15 DIAGNOSIS — M25361 Other instability, right knee: Secondary | ICD-10-CM | POA: Diagnosis not present

## 2022-11-15 DIAGNOSIS — F03918 Unspecified dementia, unspecified severity, with other behavioral disturbance: Secondary | ICD-10-CM

## 2022-11-15 DIAGNOSIS — F419 Anxiety disorder, unspecified: Secondary | ICD-10-CM | POA: Diagnosis not present

## 2022-11-15 NOTE — Progress Notes (Unsigned)
Location:  Other [facility] Memory Care Unit) Nursing Home Room Number: 202 A Place of Service:  ALF [phone]) Provider:  Kenard Gower, DNP, FNP-BC  Patient Care Team: Earnestine Mealing, MD as PCP - General [facility] Medicine)  Extended Emergency Contact Information Primary Emergency Contact: [place], [place] [address] [name] of [place] Home Phone: [phone] Mobile Phone: [phone] Relation: Son Secondary Emergency Contact: Egbert Garibaldi, [place] [address] [name] of [place] Home Phone: [phone] Mobile Phone: [phone] Relation: Daughter  Code Status:  ***  Goals of care: Advanced Directive information    08/17/2022    9:39 AM  Advanced Directives  Does Patient Have a Medical Advance Directive? Yes  Type of Advance Directive Out of facility DNR (pink MOST or yellow form)  Does patient want to make changes to medical advance directive? No - Patient declined     Chief Complaint  Patient presents with   Acute Visit    Increase in anxiety and delusion    HPI:  Pt is a 77 y.o. female seen today for medical management of chronic diseases.  ***   Past Medical History:  Diagnosis Date   Arthritis    Cancer (HCC)    Dementia (HCC)    Hypertension    Retina disorder, right    Past Surgical History:  Procedure Laterality Date   ABDOMINAL HYSTERECTOMY     COLONOSCOPY N/A 10/10/2019   Procedure: COLONOSCOPY;  Surgeon: Toledo, Boykin Nearing, MD;  Location: [facility] ENDOSCOPY;  Service: Gastroenterology;  Laterality: N/A;   JOINT REPLACEMENT     TONSILLECTOMY      Allergies  Allergen Reactions   Erythromycin Rash    Outpatient Encounter Medications as of 11/15/2022  Medication Sig   acetaminophen (TYLENOL) 325 MG tablet Take 650 mg by mouth every 4 (four) hours as needed.   aspirin 81 MG chewable tablet Chew 81 mg by mouth daily.   B Complex Vitamins (VITAMIN B COMPLEX PO) Give 1 tablet by mouth one time a day for supplement    busPIRone (BUSPAR) 5 MG tablet Take 5 mg by mouth 2 (two) times daily.   calcium carbonate (CALCIUM 600) 600 MG TABS tablet Take 600 mg by mouth 2 (two) times daily with a meal.   Cholecalciferol 25 MCG (1000 UT) tablet Take 1,000 Units by mouth daily.   guaiFENesin (ROBITUSSIN) 100 MG/5ML liquid Take 30 mLs by mouth every 4 (four) hours as needed for cough or to loosen phlegm.   losartan (COZAAR) 50 MG tablet Take 50 mg by mouth daily.   melatonin 5 MG TABS Take 5 mg by mouth at bedtime.   Multiple Vitamins-Minerals (MULTIVITAMIN ADULT EXTRA C PO) Take 1 tablet by mouth daily.   nystatin (MYCOSTATIN/NYSTOP) powder Apply 1 Application topically 2 (two) times daily. Under left breast for skin breakdown   polyethylene glycol (MIRALAX / GLYCOLAX) 17 g packet Take 17 g by mouth daily as needed.   QUEtiapine (SEROQUEL) 25 MG tablet Take 25 mg by mouth daily as needed. Give one tablet by mouth in the afternoon at 1pm. Give TWO tablets by mouth at bedtime.   traZODone (DESYREL) 100 MG tablet Take 50 mg by mouth at bedtime.   No facility-administered encounter medications on file as of 11/15/2022.    Review of Systems ***    Immunization History  Administered Date(s) Administered   Influenza, High Dose Seasonal PF 09/27/2017, 08/22/2018,  10/11/2020, 10/21/2021   Moderna Covid-19 Fall Seasonal Vaccine 85yrs & older 11/13/2021   Moderna Covid-19 Vaccine Bivalent Booster 73yrs & up 04/13/2022   PFIZER Comirnaty(Gray Top)Covid-19 Tri-Sucrose Vaccine 01/17/2019, 02/07/2019, 10/14/2019   PPD Test 12/30/2020   Pfizer Covid-19 Vaccine Bivalent Booster 9yrs & up 10/11/2020   Pneumococcal Conjugate-13 02/23/2019   Pneumococcal Polysaccharide-23 03/26/2020   Tdap 04/01/2022   Zoster Recombinant(Shingrix) 02/23/2019, 04/26/2022   Pertinent  Health Maintenance Due  Topic Date Due   INFLUENZA VACCINE  08/05/2022   DEXA SCAN  Completed   Colonoscopy  Discontinued      10/10/2019   10:54 AM  07/19/2022    3:02 PM  Fall Risk  Falls in the past year?  1  Was there an injury with Fall?  0  Fall Risk Category Calculator  2  (RETIRED) Patient Fall Risk Level Moderate fall risk   Patient at Risk for Falls Due to  History of fall(s)     Vitals:   11/15/22 1527  BP: 118/76  Pulse: 86  Resp: 20  Temp: 98.2 F (36.8 C)  SpO2: 95%  Weight: 221 lb (100.2 kg)  Height: 5\' 6"  (1.676 m)   Body mass index is 35.67 kg/m.  Physical Exam     Labs reviewed: Recent Labs    11/19/21 0000 05/06/22 0000  NA 138 139  K 4.6 4.3  CL 105 103  CO2 30* 30*  BUN 21 23*  CREATININE 0.9 1.0  CALCIUM 9.2 9.7   No results for input(s): "AST", "ALT", "ALKPHOS", "BILITOT", "PROT", "ALBUMIN" in the last 8760 hours. Recent Labs    05/06/22 0000  WBC 5.0  NEUTROABS 2,810.00  HGB 11.2*  HCT 34*  PLT 280   No results found for: "TSH" No results found for: "HGBA1C" No results found for: "CHOL", "HDL", "LDLCALC", "LDLDIRECT", "TRIG", "CHOLHDL"  Significant Diagnostic Results in last 30 days:  No results found.  Assessment/Plan ***   Family/ staff Communication: Discussed plan of care with resident and charge nurse  Labs/tests ordered:     Kenard Gower, DNP, MSN, FNP-BC [facility] [phone] (Monday-Friday 8:00 a.m. - 5:00 p.m.) [phone] (after hours)

## 2022-12-13 ENCOUNTER — Non-Acute Institutional Stay: Payer: Medicare HMO | Admitting: Student

## 2022-12-13 ENCOUNTER — Encounter: Payer: Self-pay | Admitting: Student

## 2022-12-13 DIAGNOSIS — G4733 Obstructive sleep apnea (adult) (pediatric): Secondary | ICD-10-CM

## 2022-12-13 DIAGNOSIS — I10 Essential (primary) hypertension: Secondary | ICD-10-CM

## 2022-12-13 DIAGNOSIS — F22 Delusional disorders: Secondary | ICD-10-CM | POA: Diagnosis not present

## 2022-12-13 DIAGNOSIS — F03918 Unspecified dementia, unspecified severity, with other behavioral disturbance: Secondary | ICD-10-CM

## 2022-12-13 DIAGNOSIS — G4709 Other insomnia: Secondary | ICD-10-CM

## 2022-12-13 NOTE — Progress Notes (Unsigned)
Location:  Other Nursing Home Room Number: 202 A Place of Service:  ALF (13)  Provider: Aundrea Horace  Code Status: DNR Goals of Care:     08/17/2022    9:39 AM  Advanced Directives  Does Patient Have a Medical Advance Directive? Yes  Type of Advance Directive Out of facility DNR (pink MOST or yellow form)  Does patient want to make changes to medical advance directive? No - Patient declined     Chief Complaint  Patient presents with   Medical Management of Chronic Conditions    HPI: Patient is a 77 y.o. female seen today for medical management of chronic diseases.  Patient sitting in recliner smiles and asks what he need?  Is everything okay?  This Thereasa Parkin introduced herself and patient does not recognize and is quite confused.  She is unable to give the year. Nursing states patient has had fewer behaviors since starting BuSpar for anxiety.  She has had more ambulatory challenges and fully relies on walker.  She does continue to have delusions, however has been more redirectable in the last few weeks.   Past Medical History:  Diagnosis Date   Arthritis    Cancer (HCC)    Dementia (HCC)    Hypertension    Retina disorder, right     Past Surgical History:  Procedure Laterality Date   ABDOMINAL HYSTERECTOMY     COLONOSCOPY N/A 10/10/2019   Procedure: COLONOSCOPY;  Surgeon: Toledo, Boykin Nearing, MD;  Location: [facility] ENDOSCOPY;  Service: Gastroenterology;  Laterality: N/A;   JOINT REPLACEMENT     TONSILLECTOMY      Allergies  Allergen Reactions   Erythromycin Rash    Outpatient Encounter Medications as of 12/13/2022  Medication Sig   acetaminophen (TYLENOL) 325 MG tablet Take 650 mg by mouth every 4 (four) hours as needed.   aspirin 81 MG chewable tablet Chew 81 mg by mouth daily.   B Complex Vitamins (VITAMIN B COMPLEX PO) Give 1 tablet by mouth one time a day for supplement   busPIRone (BUSPAR) 5 MG tablet Take 5 mg by mouth 2 (two) times daily.   calcium carbonate (CALCIUM  600) 600 MG TABS tablet Take 600 mg by mouth 2 (two) times daily with a meal.   Cholecalciferol 25 MCG (1000 UT) tablet Take 1,000 Units by mouth daily.   guaiFENesin (ROBITUSSIN) 100 MG/5ML liquid Take 30 mLs by mouth every 4 (four) hours as needed for cough or to loosen phlegm.   losartan (COZAAR) 50 MG tablet Take 50 mg by mouth daily.   melatonin 5 MG TABS Take 5 mg by mouth at bedtime.   Multiple Vitamins-Minerals (MULTIVITAMIN ADULT EXTRA C PO) Take 1 tablet by mouth daily.   nystatin (MYCOSTATIN/NYSTOP) powder Apply 1 Application topically 2 (two) times daily. Under left breast for skin breakdown   polyethylene glycol (MIRALAX / GLYCOLAX) 17 g packet Take 17 g by mouth daily as needed.   QUEtiapine (SEROQUEL) 25 MG tablet Take 25 mg by mouth daily as needed. Give one tablet by mouth in the afternoon at 1pm. Give TWO tablets by mouth at bedtime.   traZODone (DESYREL) 100 MG tablet Take 50 mg by mouth at bedtime.   No facility-administered encounter medications on file as of 12/13/2022.    Review of Systems:  Review of Systems  Health Maintenance  Topic Date Due   Hepatitis C Screening  Never done   INFLUENZA VACCINE  08/05/2022   COVID-19 Vaccine (7 - 2023-24 season) 09/05/2022  Medicare Annual Wellness (AWV)  03/30/2023   DTaP/Tdap/Td (2 - Td or Tdap) 03/31/2032   Pneumonia Vaccine 59+ Years old  Completed   DEXA SCAN  Completed   Zoster Vaccines- Shingrix  Completed   HPV VACCINES  Aged Out   Colonoscopy  Discontinued    Physical Exam: Vitals:   12/13/22 2048  BP: 127/77  Pulse: 62  Resp: 18  Temp: (!) 97 F (36.1 C)  SpO2: 97%  Weight: 212 lb 14.4 oz (96.6 kg)   Body mass index is 34.36 kg/m. Physical Exam Constitutional:      Appearance: She is obese.  Cardiovascular:     Rate and Rhythm: Normal rate and regular rhythm.     Pulses: Normal pulses.  Pulmonary:     Effort: Pulmonary effort is normal.  Abdominal:     Palpations: Abdomen is soft.  Skin:     General: Skin is warm and dry.  Neurological:     Mental Status: She is alert. Mental status is at baseline. She is disoriented.     Labs reviewed: Basic Metabolic Panel: Recent Labs    05/06/22 0000  NA 139  K 4.3  CL 103  CO2 30*  BUN 23*  CREATININE 1.0  CALCIUM 9.7   Liver Function Tests: No results for input(s): "AST", "ALT", "ALKPHOS", "BILITOT", "PROT", "ALBUMIN" in the last 8760 hours. No results for input(s): "LIPASE", "AMYLASE" in the last 8760 hours. No results for input(s): "AMMONIA" in the last 8760 hours. CBC: Recent Labs    05/06/22 0000  WBC 5.0  NEUTROABS 2,810.00  HGB 11.2*  HCT 34*  PLT 280   Lipid Panel: No results for input(s): "CHOL", "HDL", "LDLCALC", "TRIG", "CHOLHDL", "LDLDIRECT" in the last 8760 hours. No results found for: "HGBA1C"  Procedures since last visit: No results found.  Assessment/Plan Dementia with behavioral disturbance (HCC)  Primary hypertension  Obstructive sleep apnea  Delusional disorders (HCC)  Other insomnia Patient with history of dementia with severe delusions.  She was previously very difficult to redirect, however slightly improved with medication adjustments.  Blood pressure well-controlled with losartan 50.  Anxiety and mood well-controlled with trazodone 100 mg nightly quetiapine 25 mg daily and BuSpar 5 mg twice daily.  Continue supportive care for underlying dementia. Patient has lost 15 lbs in the last 6 months. Starting weight was severe obesity, so desired, however, given her memory changes concern for poor nutritional status. Will start protein supplementation as tolerated.   Labs/tests ordered:  * No order type specified * Next appt:  Visit date not found

## 2023-02-21 ENCOUNTER — Encounter: Payer: Self-pay | Admitting: Student

## 2023-02-21 NOTE — Progress Notes (Unsigned)
 A user error has taken place: encounter opened in error, closed for administrative reasons.

## 2023-02-23 ENCOUNTER — Encounter: Payer: Self-pay | Admitting: Student

## 2023-02-25 ENCOUNTER — Encounter: Payer: Self-pay | Admitting: Adult Health

## 2023-02-25 ENCOUNTER — Non-Acute Institutional Stay: Payer: Self-pay | Admitting: Adult Health

## 2023-02-25 DIAGNOSIS — I10 Essential (primary) hypertension: Secondary | ICD-10-CM | POA: Diagnosis not present

## 2023-02-25 DIAGNOSIS — F03918 Unspecified dementia, unspecified severity, with other behavioral disturbance: Secondary | ICD-10-CM

## 2023-02-25 NOTE — Progress Notes (Signed)
 Location:  Other Nursing Home Room Number: [facility] 202A Place of Service:  ALF [phone]) Provider:  Kenard Gower, DNP, FNP-BC  Patient Care Team: Kimberly Mealing, MD as PCP - General [facility] Medicine)  Extended Emergency Contact Information Primary Emergency Contact: [place], [place] [address] Kimberly Small of [place] Home Phone: [phone] Mobile Phone: [phone] Relation: Son Secondary Emergency Contact: Kimberly [place], [place] [address] Kimberly Small of [place] Home Phone: [phone] Mobile Phone: [phone] Relation: Daughter  Code Status:   DNR  Goals of care: Advanced Directive information    08/17/2022    9:39 AM  Advanced Directives  Does Patient Have a Medical Advance Directive? Yes  Type of Advance Directive Out of facility DNR (pink MOST or yellow form)  Does patient want to make changes to medical advance directive? No - Patient declined     Chief Complaint  Patient presents with   Agitation    Agitation.     HPI:  Pt is a 78 y.o. female seen today for an acute visit for agitation. She is a resident of [facility]. She walks with a walker but has been getting up without assistance and not using her walker. She refuses to sit down. She looks for her mother and the "little children". She currently takes Buspar 20 mg BID and Seroquel 50 mg at lunch and bedtime.   BP 143/75, takes Losartan for hypertension.   Past Medical History:  Diagnosis Date   Arthritis    Cancer (HCC)    Dementia (HCC)    Hypertension    Retina disorder, right    Past Surgical History:  Procedure Laterality Date   ABDOMINAL HYSTERECTOMY     COLONOSCOPY N/A 10/10/2019   Procedure: COLONOSCOPY;  Surgeon: Toledo, Boykin Nearing, MD;  Location: [facility] ENDOSCOPY;  Service: Gastroenterology;  Laterality: N/A;   JOINT REPLACEMENT     TONSILLECTOMY      Allergies  Allergen Reactions   Erythromycin Rash    Outpatient  Encounter Medications as of 02/25/2023  Medication Sig   acetaminophen (TYLENOL) 325 MG tablet Take 650 mg by mouth every 4 (four) hours as needed.   aspirin 81 MG chewable tablet Chew 81 mg by mouth daily.   B Complex Vitamins (VITAMIN B COMPLEX PO) Give 1 tablet by mouth one time a day for supplement   busPIRone (BUSPAR) 5 MG tablet Take 5 mg by mouth 2 (two) times daily.   calcium carbonate (CALCIUM 600) 600 MG TABS tablet Take 600 mg by mouth 2 (two) times daily with a meal.   Cholecalciferol 25 MCG (1000 UT) tablet Take 1,000 Units by mouth daily.   guaiFENesin (ROBITUSSIN) 100 MG/5ML liquid Take 30 mLs by mouth every 4 (four) hours as needed for cough or to loosen phlegm.   losartan (COZAAR) 50 MG tablet Take 50 mg by mouth daily.   melatonin 5 MG TABS Take 5 mg by mouth at bedtime.   Multiple Vitamins-Minerals (MULTIVITAMIN ADULT EXTRA C PO) Take 1 tablet by mouth daily.   nystatin (MYCOSTATIN/NYSTOP) powder Apply 1 Application topically 2 (two) times daily. Under left breast for skin breakdown   polyethylene glycol (MIRALAX / GLYCOLAX) 17 g packet Take 17 g by mouth daily as needed.   QUEtiapine (SEROQUEL) 25 MG tablet Take 25 mg by mouth daily as needed. Give one tablet by mouth in the afternoon at 1pm.  Give TWO tablets by mouth at bedtime.   traZODone (DESYREL) 100 MG tablet Take 50 mg by mouth at bedtime.   No facility-administered encounter medications on file as of 02/25/2023.    Review of Systems   Unable to obtain due to dementia.    Immunization History  Administered Date(s) Administered   Influenza, High Dose Seasonal PF 09/27/2017, 08/22/2018, 10/11/2020, 10/21/2021   Influenza-Unspecified 10/29/2022   Moderna Covid-19 Fall Seasonal Vaccine 7yrs & older 11/13/2021   Moderna Covid-19 Vaccine Bivalent Booster 63yrs & up 04/13/2022   PFIZER Comirnaty(Gray Top)Covid-19 Tri-Sucrose Vaccine 01/17/2019, 02/07/2019, 10/14/2019   PPD Test 12/30/2020   Pfizer Covid-19 Vaccine  Bivalent Booster 56yrs & up 10/11/2020   Pneumococcal Conjugate-13 02/23/2019   Pneumococcal Polysaccharide-23 03/26/2020   Tdap 04/01/2022   Zoster Recombinant(Shingrix) 02/23/2019, 04/26/2022   Pertinent  Health Maintenance Due  Topic Date Due   INFLUENZA VACCINE  Completed   DEXA SCAN  Completed   Colonoscopy  Discontinued      10/10/2019   10:54 AM 07/19/2022    3:02 PM  Fall Risk  Falls in the past year?  1  Was there an injury with Fall?  0  Fall Risk Category Calculator  2  (RETIRED) Patient Fall Risk Level Moderate fall risk   Patient at Risk for Falls Due to  History of fall(s)     Vitals:   02/25/23 1151 02/25/23 1200  BP: (!) 143/75 (!) 149/80  Pulse: 67   Resp: 18   Temp: 97.7 F (36.5 C)   SpO2: 95%   Weight: 212 lb 9.6 oz (96.4 kg)   Height: 5\' 6"  (1.676 m)    Body mass index is 34.31 kg/m.  Physical Exam Constitutional:      General: She is not in acute distress.    Appearance: She is obese.  HENT:     Head: Normocephalic and atraumatic.     Nose: Nose normal.     Mouth/Throat:     Mouth: Mucous membranes are moist.  Eyes:     Conjunctiva/sclera: Conjunctivae normal.  Cardiovascular:     Rate and Rhythm: Normal rate and regular rhythm.  Pulmonary:     Effort: Pulmonary effort is normal.     Breath sounds: Normal breath sounds.  Abdominal:     General: Bowel sounds are normal.     Palpations: Abdomen is soft.  Musculoskeletal:        General: Normal range of motion.     Cervical back: Normal range of motion.  Skin:    General: Skin is warm and dry.  Psychiatric:     Comments: anxious      Labs reviewed: Recent Labs    05/06/22 0000  NA 139  K 4.3  CL 103  CO2 30*  BUN 23*  CREATININE 1.0  CALCIUM 9.7   No results for input(s): "AST", "ALT", "ALKPHOS", "BILITOT", "PROT", "ALBUMIN" in the last 8760 hours. Recent Labs    05/06/22 0000  WBC 5.0  NEUTROABS 2,810.00  HGB 11.2*  HCT 34*  PLT 280   No results found for:  "TSH" No results found for: "HGBA1C" No results found for: "CHOL", "HDL", "LDLCALC", "LDLDIRECT", "TRIG", "CHOLHDL"  Significant Diagnostic Results in last 30 days:  No results found.  Assessment/Plan  1. Dementia with behavioral disturbance (HCC) (Primary) -  will add Seroquel 25 mg in the morning -  continue Seroquel 50 mg at lunch and bedtime, Seroquel 25 mg daily PRN and Buspar 20 mg BID -  QUEtiapine (SEROQUEL) 25 MG tablet; Take 1 tablet (25 mg total) by mouth every morning.  2. Primary hypertension -  BP stable -  continue Losartan    Family/ staff Communication: Discussed plan of care with charge nurse.  Labs/tests ordered: None    Kimberly Gower, DNP, MSN, FNP-BC [facility] [phone] (Monday-Friday 8:00 a.m. - 5:00 p.m.) [phone] (after hours)

## 2023-02-26 MED ORDER — QUETIAPINE FUMARATE 25 MG PO TABS
25.0000 mg | ORAL_TABLET | Freq: Every morning | ORAL | Status: DC
Start: 2023-02-26 — End: 2023-08-16

## 2023-03-29 ENCOUNTER — Non-Acute Institutional Stay: Payer: Self-pay | Admitting: Nurse Practitioner

## 2023-03-29 ENCOUNTER — Encounter: Payer: Self-pay | Admitting: Nurse Practitioner

## 2023-03-29 DIAGNOSIS — F419 Anxiety disorder, unspecified: Secondary | ICD-10-CM

## 2023-03-29 DIAGNOSIS — I10 Essential (primary) hypertension: Secondary | ICD-10-CM | POA: Diagnosis not present

## 2023-03-29 DIAGNOSIS — G301 Alzheimer's disease with late onset: Secondary | ICD-10-CM

## 2023-03-29 DIAGNOSIS — E669 Obesity, unspecified: Secondary | ICD-10-CM | POA: Insufficient documentation

## 2023-03-29 DIAGNOSIS — F02B3 Dementia in other diseases classified elsewhere, moderate, with mood disturbance: Secondary | ICD-10-CM | POA: Insufficient documentation

## 2023-03-29 DIAGNOSIS — E66812 Obesity, class 2: Secondary | ICD-10-CM | POA: Insufficient documentation

## 2023-03-29 DIAGNOSIS — G4709 Other insomnia: Secondary | ICD-10-CM

## 2023-03-29 DIAGNOSIS — R296 Repeated falls: Secondary | ICD-10-CM

## 2023-03-29 DIAGNOSIS — F03918 Unspecified dementia, unspecified severity, with other behavioral disturbance: Secondary | ICD-10-CM | POA: Insufficient documentation

## 2023-03-29 NOTE — Assessment & Plan Note (Signed)
 Stable on current regimen. Continue medication has prescribed.

## 2023-03-29 NOTE — Assessment & Plan Note (Signed)
 Continues on melatonin at bedtime.

## 2023-03-29 NOTE — Assessment & Plan Note (Signed)
 Noted, expect weight loss with progression of dementia.

## 2023-03-29 NOTE — Progress Notes (Signed)
 Location:  Other [facility].  Nursing Home Room Number: [facility] [address] Place of Service:  ALF [phone]) [name], NP  PCP: Earnestine Mealing, MD  Patient Care Team: Earnestine Mealing, MD as PCP - General [facility] Medicine)  Extended Emergency Contact Information Primary Emergency Contact: [place], [place] [address] [name] of [place] Home Phone: [phone] Mobile Phone: [phone] Relation: Son Secondary Emergency Contact: [place], [place] [address] [name] of [place] Home Phone: [phone] Mobile Phone: [phone] Relation: Daughter  Goals of care: Advanced Directive information    08/17/2022    9:39 AM  Advanced Directives  Does Patient Have a Medical Advance Directive? Yes  Type of Advance Directive Out of facility DNR (pink MOST or yellow form)  Does patient want to make changes to medical advance directive? No - Patient declined     Chief Complaint  Patient presents with   Medical Management of Chronic Issues    Medical Management of Chronic Issues.     HPI:  Pt is a 78 y.o. female seen today for medical management of chronic disease. Pt with hx of dementia. She is at high risk for falls and requires close supervision during the day.  She does not have any complaints. Does not complain of pain.  No LE edema.  No behaviors at this time.  Staff without acute concerns.    Past Medical History:  Diagnosis Date   Arthritis    Cancer (HCC)    Dementia (HCC)    Hypertension    Retina disorder, right    Past Surgical History:  Procedure Laterality Date   ABDOMINAL HYSTERECTOMY     COLONOSCOPY N/A 10/10/2019   Procedure: COLONOSCOPY;  Surgeon: Toledo, Boykin Nearing, MD;  Location: [facility] ENDOSCOPY;  Service: Gastroenterology;  Laterality: N/A;   JOINT REPLACEMENT     TONSILLECTOMY      Allergies  Allergen Reactions   Erythromycin Rash    Outpatient Encounter Medications as of 03/29/2023  Medication Sig    acetaminophen (TYLENOL) 325 MG tablet Take 650 mg by mouth every 4 (four) hours as needed.   aspirin 81 MG chewable tablet Chew 81 mg by mouth every other day.   B Complex Vitamins (VITAMIN B COMPLEX PO) Give 1 tablet by mouth one time a day for supplement   busPIRone (BUSPAR) 10 MG tablet Take 20 mg by mouth 2 (two) times daily.   calcium carbonate (CALCIUM 600) 600 MG TABS tablet Take 600 mg by mouth 2 (two) times daily with a meal.   Cholecalciferol 25 MCG (1000 UT) tablet Take 1,000 Units by mouth daily.   guaiFENesin (ROBITUSSIN) 100 MG/5ML liquid Take 30 mLs by mouth every 4 (four) hours as needed for cough or to loosen phlegm.   losartan (COZAAR) 50 MG tablet Take 50 mg by mouth daily.   melatonin 5 MG TABS Take 5 mg by mouth at bedtime.   Multiple Vitamins-Minerals (MULTIVITAMIN ADULT EXTRA C PO) Take 1 tablet by mouth daily.   nystatin (MYCOSTATIN/NYSTOP) powder Apply 1 Application topically 2 (two) times daily. Under left breast for skin breakdown   polyethylene glycol (MIRALAX / GLYCOLAX) 17 g packet Take 17 g by mouth daily as needed.   QUEtiapine (SEROQUEL) 25 MG tablet Take 1 tablet (25 mg total) by mouth every morning.   QUEtiapine (SEROQUEL) 50 MG tablet Take 50 mg by mouth daily. At [facility].  Take one tablet  by mouth at bedtime. Take one tablet by mouth prn.   [DISCONTINUED] busPIRone (BUSPAR) 5 MG tablet Take 5 mg by mouth 2 (two) times daily.   [DISCONTINUED] QUEtiapine (SEROQUEL) 25 MG tablet Take 25 mg by mouth daily as needed. Give one tablet by mouth in the afternoon at 1pm. Give TWO tablets by mouth at bedtime.   [DISCONTINUED] traZODone (DESYREL) 100 MG tablet Take 50 mg by mouth at bedtime. (Patient not taking: Reported on 03/29/2023)   No facility-administered encounter medications on file as of 03/29/2023.    Review of Systems  Unable to perform ROS: Dementia     Immunization History  Administered Date(s) Administered   Influenza, High Dose Seasonal PF  09/27/2017, 08/22/2018, 10/11/2020, 10/21/2021   Influenza-Unspecified 10/29/2022   Moderna Covid-19 Fall Seasonal Vaccine 53yrs & older 11/13/2021   Moderna Covid-19 Vaccine Bivalent Booster 16yrs & up 04/13/2022   PFIZER Comirnaty(Gray Top)Covid-19 Tri-Sucrose Vaccine 01/17/2019, 02/07/2019, 10/14/2019   PPD Test 12/30/2020   Pfizer Covid-19 Vaccine Bivalent Booster 70yrs & up 10/11/2020   Pneumococcal Conjugate-13 02/23/2019   Pneumococcal Polysaccharide-23 03/26/2020   Tdap 04/01/2022   Zoster Recombinant(Shingrix) 02/23/2019, 04/26/2022   Pertinent  Health Maintenance Due  Topic Date Due   INFLUENZA VACCINE  Completed   DEXA SCAN  Completed   Colonoscopy  Discontinued      10/10/2019   10:54 AM 07/19/2022    3:02 PM  Fall Risk  Falls in the past year?  1  Was there an injury with Fall?  0  Fall Risk Category Calculator  2  (RETIRED) Patient Fall Risk Level Moderate fall risk   Patient at Risk for Falls Due to  History of fall(s)   Functional Status Survey:    Vitals:   03/29/23 0826 03/29/23 0835  BP: (!) 145/84 126/74  Pulse: 63   Resp: 16   Temp: (!) 97.3 F (36.3 C)   SpO2: 94%   Weight: 215 lb (97.5 kg)   Height: 5\' 4"  (1.626 m)    Body mass index is 36.9 kg/m. Physical Exam Constitutional:      General: She is not in acute distress.    Appearance: She is well-developed. She is not diaphoretic.  HENT:     Head: Normocephalic and atraumatic.     Mouth/Throat:     Pharynx: No oropharyngeal exudate.  Eyes:     Conjunctiva/sclera: Conjunctivae normal.     Pupils: Pupils are equal, round, and reactive to light.  Cardiovascular:     Rate and Rhythm: Normal rate and regular rhythm.     Heart sounds: Normal heart sounds.  Pulmonary:     Effort: Pulmonary effort is normal.     Breath sounds: Normal breath sounds.  Abdominal:     General: Bowel sounds are normal.     Palpations: Abdomen is soft.  Musculoskeletal:     Cervical back: Normal range of motion  and neck supple.     Right lower leg: No edema.     Left lower leg: No edema.  Skin:    General: Skin is warm and dry.  Neurological:     Mental Status: She is alert. Mental status is at baseline.     Motor: Weakness present.     Gait: Gait abnormal.  Psychiatric:        Mood and Affect: Mood normal.     Labs reviewed: Recent Labs    05/06/22 0000  NA 139  K 4.3  CL 103  CO2 30*  BUN 23*  CREATININE 1.0  CALCIUM 9.7   No results for input(s): "AST", "ALT", "ALKPHOS", "BILITOT", "PROT", "ALBUMIN" in the last 8760 hours. Recent Labs    05/06/22 0000  WBC 5.0  NEUTROABS 2,810.00  HGB 11.2*  HCT 34*  PLT 280   No results found for: "TSH" No results found for: "HGBA1C" No results found for: "CHOL", "HDL", "LDLCALC", "LDLDIRECT", "TRIG", "CHOLHDL"  Significant Diagnostic Results in last 30 days:  No results found.  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    Recurrent falls High risk for falls, continues on fall precautions.   Other insomnia Continues on melatonin at bedtime.   Anxiety Stable on current regimen. Continue medication has prescribed.   Morbid obesity (HCC) Noted, expect weight loss with progression of dementia.      Janene Harvey. Biagio Borg [facility] [phone]

## 2023-03-29 NOTE — Assessment & Plan Note (Signed)
 High risk for falls, continues on fall precautions.

## 2023-03-29 NOTE — Assessment & Plan Note (Signed)
 Stable, no acute changes in cognitive or functional status, continue supportive care.

## 2023-03-29 NOTE — Assessment & Plan Note (Signed)
 Blood pressure well controlled, goal bp <140/90 Continue current medications and dietary modifications follow metabolic panel

## 2023-04-18 ENCOUNTER — Encounter: Payer: Self-pay | Admitting: Student

## 2023-04-18 ENCOUNTER — Non-Acute Institutional Stay: Payer: Self-pay | Admitting: Student

## 2023-04-18 DIAGNOSIS — H348312 Tributary (branch) retinal vein occlusion, right eye, stable: Secondary | ICD-10-CM | POA: Diagnosis not present

## 2023-04-18 DIAGNOSIS — F22 Delusional disorders: Secondary | ICD-10-CM | POA: Diagnosis not present

## 2023-04-18 DIAGNOSIS — G9081 Serotonin syndrome: Secondary | ICD-10-CM

## 2023-04-18 DIAGNOSIS — F03918 Unspecified dementia, unspecified severity, with other behavioral disturbance: Secondary | ICD-10-CM | POA: Diagnosis not present

## 2023-04-18 NOTE — Progress Notes (Signed)
 Location:  Other [facility].  Nursing Home Room Number: [facility] [address] Place of Service:  ALF [phone]) Provider:  Valrie Gehrig, MD  Patient Care Team: Valrie Gehrig, MD as PCP - General [facility] Medicine)  Extended Emergency Contact Information Primary Emergency Contact: [place], [place], [place] [address] [place] Home Phone: [phone] Mobile Phone: [phone] Relation: Son Secondary Emergency Contact: [place], [place] [address] [place] Home Phone: [phone] Mobile Phone: [phone] Relation: Daughter  Code Status:  DNR Goals of care: Advanced Directive information    08/17/2022    9:39 AM  Advanced Directives  Does Patient Have a Medical Advance Directive? Yes  Type of Advance Directive Out of facility DNR (pink MOST or yellow form)  Does patient want to make changes to medical advance directive? No - Patient declined     Chief Complaint  Patient presents with   Fall    Fall    HPI:  Pt is a 78 y.o. female seen today for an acute visit for Fall. Patient has had increased confusion and elopement/exit-seeking behavior per nursing. She is looking for her parents.    Past Medical History:  Diagnosis Date   Arthritis    Cancer (HCC)    Dementia (HCC)    Hypertension    Retina disorder, right    Past Surgical History:  Procedure Laterality Date   ABDOMINAL HYSTERECTOMY     COLONOSCOPY N/A 10/10/2019   Procedure: COLONOSCOPY;  Surgeon: Toledo, Alphonsus Jeans, MD;  Location: [facility] ENDOSCOPY;  Service: Gastroenterology;  Laterality: N/A;   JOINT REPLACEMENT     TONSILLECTOMY      Allergies  Allergen Reactions   Erythromycin Rash    Outpatient Encounter Medications as of 04/18/2023  Medication Sig   acetaminophen (TYLENOL) 325 MG tablet Take 650 mg by mouth every 4 (four) hours as needed.   aspirin 81 MG chewable tablet Chew 81 mg by mouth every other day.   B Complex Vitamins (VITAMIN B COMPLEX PO)  Give 1 tablet by mouth one time a day for supplement   busPIRone (BUSPAR) 10 MG tablet Take 20 mg by mouth 2 (two) times daily.   calcium carbonate (CALCIUM 600) 600 MG TABS tablet Take 600 mg by mouth 2 (two) times daily with a meal.   Cholecalciferol 25 MCG (1000 UT) tablet Take 1,000 Units by mouth daily.   guaiFENesin (ROBITUSSIN) 100 MG/5ML liquid Take 30 mLs by mouth every 4 (four) hours as needed for cough or to loosen phlegm.   losartan (COZAAR) 50 MG tablet Take 50 mg by mouth daily.   melatonin 5 MG TABS Take 5 mg by mouth at bedtime.   Multiple Vitamins-Minerals (MULTIVITAMIN ADULT EXTRA C PO) Take 1 tablet by mouth daily.   nystatin (MYCOSTATIN/NYSTOP) powder Apply 1 Application topically 2 (two) times daily. Under left breast for skin breakdown   polyethylene glycol (MIRALAX / GLYCOLAX) 17 g packet Take 17 g by mouth daily as needed.   QUEtiapine  (SEROQUEL ) 25 MG tablet Take 1 tablet (25 mg total) by mouth every morning.   QUEtiapine  (SEROQUEL ) 50 MG tablet Take 50 mg by mouth. At [facility].  Take Two tablets by mouth at bedtime   No facility-administered encounter medications on file as of 04/18/2023.    Review of Systems  Immunization History  Administered Date(s) Administered   Influenza, High Dose Seasonal PF 09/27/2017, 08/22/2018, 10/11/2020, 10/21/2021  Influenza-Unspecified 10/29/2022   Moderna Covid-19 Fall Seasonal Vaccine 17yrs & older 11/13/2021   Moderna Covid-19 Vaccine Bivalent Booster 16yrs & up 04/13/2022   PFIZER Comirnaty(Gray Top)Covid-19 Tri-Sucrose Vaccine 01/17/2019, 02/07/2019, 10/14/2019   PPD Test 12/30/2020   Pfizer Covid-19 Vaccine Bivalent Booster 36yrs & up 10/11/2020   Pneumococcal Conjugate-13 02/23/2019   Pneumococcal Polysaccharide-23 03/26/2020   Tdap 04/01/2022   Zoster Recombinant(Shingrix) 02/23/2019, 04/26/2022   Pertinent  Health Maintenance Due  Topic Date Due   INFLUENZA VACCINE  08/05/2023   DEXA SCAN  Completed   Colonoscopy   Discontinued      10/10/2019   10:54 AM 07/19/2022    3:02 PM  Fall Risk  Falls in the past year?  1  Was there an injury with Fall?  0  Fall Risk Category Calculator  2  (RETIRED) Patient Fall Risk Level Moderate fall risk   Patient at Risk for Falls Due to  History of fall(s)   Functional Status Survey:    Vitals:   04/18/23 0858 04/18/23 0907  BP: (!) 150/86 120/71  Pulse: 72   Resp: 18   Temp: 97.8 F (36.6 C)   SpO2: 96%   Weight: 218 lb 3.2 oz (99 kg)   Height: 5\' 4"  (1.626 m)    Body mass index is 37.45 kg/m. Physical Exam Constitutional:      Appearance: She is obese.  Cardiovascular:     Rate and Rhythm: Normal rate.     Pulses: Normal pulses.  Pulmonary:     Effort: Pulmonary effort is normal.  Neurological:     Mental Status: She is alert.     Comments: clonus     Labs reviewed: Recent Labs    05/06/22 0000  NA 139  K 4.3  CL 103  CO2 30*  BUN 23*  CREATININE 1.0  CALCIUM 9.7   No results for input(s): "AST", "ALT", "ALKPHOS", "BILITOT", "PROT", "ALBUMIN" in the last 8760 hours. Recent Labs    05/06/22 0000  WBC 5.0  NEUTROABS 2,810.00  HGB 11.2*  HCT 34*  PLT 280   No results found for: "TSH" No results found for: "HGBA1C" No results found for: "CHOL", "HDL", "LDLCALC", "LDLDIRECT", "TRIG", "CHOLHDL"  Significant Diagnostic Results in last 30 days:  No results found.  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 She has clonus and abnormal, unintentional movements. Will work on dose reduction of medications. Decrease buspar to 5mg  BID 2 weeks then stop. Continue seroquel  at current dose. Start Cyproheptadine 4g daily. Will follow up in 1 week to determine duration of cyproheptadine or need for further medication decrease. Encourage non-medicated  approach to redirection and support for dementia behaviors/delusions.  Family/ staff Communication: nursing  Labs/tests ordered:  CBC, CMP q21mo

## 2023-04-22 ENCOUNTER — Encounter: Payer: Self-pay | Admitting: Student

## 2023-05-03 ENCOUNTER — Encounter: Payer: Self-pay | Admitting: Nurse Practitioner

## 2023-05-03 ENCOUNTER — Non-Acute Institutional Stay (SKILLED_NURSING_FACILITY): Payer: Self-pay | Admitting: Nurse Practitioner

## 2023-05-03 DIAGNOSIS — Z0000 Encounter for general adult medical examination without abnormal findings: Secondary | ICD-10-CM | POA: Diagnosis not present

## 2023-05-03 NOTE — Progress Notes (Signed)
 Subjective:   Kimberly Small is a 78 y.o. female who presents for Medicare Annual (Subsequent) preventive examination.  Visit Complete: In person at [facility]   Cardiac Risk Factors include: advanced age (>26men, >37 women);sedentary lifestyle;hypertension;obesity (BMI >30kg/m2)     Objective:    Today's Vitals   05/03/23 0814  BP: 121/68  Pulse: 67  Resp: 16  Temp: 98.9 F (37.2 C)  SpO2: 97%  Weight: 219 lb 6.4 oz (99.5 kg)  Height: 5\' 4"  (1.626 m)   Body mass index is 37.66 kg/m.     08/17/2022    9:39 AM 07/19/2022    9:38 AM 07/09/2022    8:50 AM 05/24/2022    9:20 AM 05/24/2022    9:16 AM 04/14/2022    8:30 AM 03/30/2022    9:15 AM  Advanced Directives  Does Patient Have a Medical Advance Directive? Yes No No No No No No  Type of Advance Directive Out of facility DNR (pink MOST or yellow form)        Does patient want to make changes to medical advance directive? No - Patient declined No - Patient declined No - Patient declined No - Patient declined No - Patient declined    Would patient like information on creating a medical advance directive?      No - Patient declined No - Patient declined    Current Medications (verified) Outpatient Encounter Medications as of 05/03/2023  Medication Sig   acetaminophen (TYLENOL) 325 MG tablet Take 650 mg by mouth every 4 (four) hours as needed.   aspirin 81 MG chewable tablet Chew 81 mg by mouth every other day.   B Complex Vitamins (VITAMIN B COMPLEX PO) Give 1 tablet by mouth one time a day for supplement   busPIRone (BUSPAR) 5 MG tablet Take 5 mg by mouth daily.   calcium carbonate (CALCIUM 600) 600 MG TABS tablet Take 600 mg by mouth 2 (two) times daily with a meal.   Cholecalciferol 25 MCG (1000 UT) tablet Take 1,000 Units by mouth daily.   cyproheptadine (PERIACTIN) 4 MG tablet Take 4 mg by mouth daily.   guaiFENesin (ROBITUSSIN) 100 MG/5ML liquid Take 30 mLs by mouth every 4 (four) hours as needed for  cough or to loosen phlegm.   melatonin 5 MG TABS Take 5 mg by mouth at bedtime.   Multiple Vitamins-Minerals (MULTIVITAMIN ADULT EXTRA C PO) Take 1 tablet by mouth daily.   nystatin (MYCOSTATIN/NYSTOP) powder Apply 1 Application topically 2 (two) times daily. Under left breast for skin breakdown   polyethylene glycol (MIRALAX / GLYCOLAX) 17 g packet Take 17 g by mouth daily as needed.   QUEtiapine  (SEROQUEL ) 25 MG tablet Take 1 tablet (25 mg total) by mouth every morning.   QUEtiapine  (SEROQUEL ) 50 MG tablet Take 50 mg by mouth. At [facility].  Take 1.5 tablets by mouth at bedtime   busPIRone (BUSPAR) 10 MG tablet Take 20 mg by mouth 2 (two) times daily. (Patient not taking: Reported on 05/03/2023)   losartan (COZAAR) 50 MG tablet Take 50 mg by mouth daily. (Patient not taking: Reported on 05/03/2023)   No facility-administered encounter medications on file as of 05/03/2023.    Allergies (verified) Erythromycin   History: Past Medical History:  Diagnosis Date   Arthritis    Cancer (HCC)    Dementia (HCC)    Hypertension    Retina disorder, right    Past Surgical History:  Procedure Laterality Date  ABDOMINAL HYSTERECTOMY     COLONOSCOPY N/A 10/10/2019   Procedure: COLONOSCOPY;  Surgeon: Toledo, Alphonsus Jeans, MD;  Location: [facility] ENDOSCOPY;  Service: Gastroenterology;  Laterality: N/A;   JOINT REPLACEMENT     TONSILLECTOMY     Family History  Problem Relation Age of Onset   Breast cancer Neg Hx    Social History   Socioeconomic History   Marital status: Single    Spouse name: Not on file   Number of children: Not on file   Years of education: Not on file   Highest education level: Not on file  Occupational History   Not on file  Tobacco Use   Smoking status: Never   Smokeless tobacco: Never  Vaping Use   Vaping status: Never Used  Substance and Sexual Activity   Alcohol use: Yes    Alcohol/week: 1.0 standard drink of alcohol    Types: 1 Glasses of wine per week   Drug  use: Never   Sexual activity: Not Currently  Other Topics Concern   Not on file  Social History Narrative   Not on file   Social Drivers of Health   Financial Resource Strain: Not on file  Food Insecurity: Not on file  Transportation Needs: Not on file  Physical Activity: Not on file  Stress: Not on file  Social Connections: Not on file    Tobacco Counseling Counseling given: Not Answered   Clinical Intake:  Pre-visit preparation completed: Yes  Pain : No/denies pain     BMI - recorded: 37 Nutritional Status: BMI > 30  Obese Nutritional Risks: None  How often do you need to have someone help you when you read instructions, pamphlets, or other written materials from your doctor or pharmacy?: 5 - Always         Activities of Daily Living    05/03/2023    8:01 AM  In your present state of health, do you have any difficulty performing the following activities:  Hearing? 0  Vision? 0  Difficulty concentrating or making decisions? 1  Walking or climbing stairs? 1  Dressing or bathing? 1  Doing errands, shopping? 1  Preparing Food and eating ? Y  Comment unable to prepare food but able to feed herself  Using the Toilet? Y  In the past six months, have you accidently leaked urine? N  Do you have problems with loss of bowel control? N  Managing your Medications? Y  Managing your Finances? Y  Housekeeping or managing your Housekeeping? Y    Patient Care Team: Valrie Gehrig, MD as PCP - General (Family Medicine)  Indicate any recent Medical Services you may have received from other than [facility] providers in the past year (date may be approximate).     Assessment:   This is a routine wellness examination for [name].  Hearing/Vision screen No results found.   Goals Addressed   None    Depression Screen    05/03/2023    8:04 AM  PHQ 2/9 Scores  Exception Documentation Other- indicate reason in comment box    Fall Risk    05/03/2023    8:04 AM  07/19/2022    3:02 PM  Fall Risk   Falls in the past year? 1 1  Number falls in past yr: 1 1  Injury with Fall? 0 0  Risk for fall due to : History of fall(s);Impaired mobility History of fall(s)  Follow up Falls evaluation completed     MEDICARE RISK AT HOME:  Medicare Risk at Home Any stairs in or around the home?: No If so, are there any without handrails?: Yes Home free of loose throw rugs in walkways, pet beds, electrical cords, etc?: Yes Adequate lighting in your home to reduce risk of falls?: Yes Life alert?: No Use of a cane, walker or w/c?: Yes Grab bars in the bathroom?: Yes Shower chair or bench in shower?: Yes Elevated toilet seat or a handicapped toilet?: Yes  TIMED UP AND GO:  Was the test performed?  No    Cognitive Function:        Immunizations Immunization History  Administered Date(s) Administered   Influenza, High Dose Seasonal PF 09/27/2017, 08/22/2018, 10/11/2020, 10/21/2021   Influenza-Unspecified 10/29/2022   Moderna Covid-19 Fall Seasonal Vaccine 40yrs & older 11/13/2021   Moderna Covid-19 Vaccine Bivalent Booster 32yrs & up 04/13/2022   PFIZER Comirnaty(Gray Top)Covid-19 Tri-Sucrose Vaccine 01/17/2019, 02/07/2019, 10/14/2019   PPD Test 12/30/2020   Pfizer Covid-19 Vaccine Bivalent Booster 11yrs & up 10/11/2020   Pneumococcal Conjugate-13 02/23/2019   Pneumococcal Polysaccharide-23 03/26/2020   Tdap 04/01/2022   Unspecified SARS-COV-2 Vaccination 04/16/2023   Zoster Recombinant(Shingrix) 02/23/2019, 04/26/2022    TDAP status: Up to date  Flu Vaccine status: Up to date  Pneumococcal vaccine status: Up to date  Covid-19 vaccine status: Information provided on how to obtain vaccines.   Qualifies for Shingles Vaccine? Yes   Zostavax completed No   Shingrix Completed?: Yes  Screening Tests Health Maintenance  Topic Date Due   Hepatitis C Screening  03/28/2024 (Originally 06/16/1963)   INFLUENZA VACCINE  08/05/2023   COVID-19 Vaccine (8  - 2024-25 season) 10/16/2023   Medicare Annual Wellness (AWV)  05/02/2024   DTaP/Tdap/Td (2 - Td or Tdap) 03/31/2032   Pneumonia Vaccine 23+ Years old  Completed   DEXA SCAN  Completed   Zoster Vaccines- Shingrix  Completed   HPV VACCINES  Aged Out   Meningococcal B Vaccine  Aged Out   Colonoscopy  Discontinued    Health Maintenance  There are no preventive care reminders to display for this patient.   Colorectal cancer screening: No longer required.   Mammogram status: No longer required due to age.  Bone Density status: Completed 03/31/2022. Results reflect: Bone density results: OSTEOPENIA. Repeat every 2 years.  Lung Cancer Screening: (Low Dose CT Chest recommended if Age 30-80 years, 20 pack-year currently smoking OR have quit w/in 15years.) does not qualify.   Lung Cancer Screening Referral: na  Additional Screening:  Hepatitis C Screening: does qualify  Vision Screening: Recommended annual ophthalmology exams for early detection of glaucoma and other disorders of the eye. Is the patient up to date with their annual eye exam?  No  Who is the provider or what is the name of the office in which the patient attends annual eye exams? Unable to complete due to dementia  Dental Screening: Recommended annual dental exams for proper oral hygiene  Community Resource Referral / Chronic Care Management: CRR required this visit?  No   CCM required this visit?  No     Plan:     I have personally reviewed and noted the following in the patient's chart:   Medical and social history Use of alcohol, tobacco or illicit drugs  Current medications and supplements including opioid prescriptions. Patient is not currently taking opioid prescriptions. Functional ability and status Nutritional status Physical activity Advanced directives List of other physicians Hospitalizations, surgeries, and ER visits in previous 12 months Vitals Screenings to include cognitive,  depression,  and falls Referrals and appointments  In addition, I have reviewed and discussed with patient certain preventive protocols, quality metrics, and best practice recommendations. A written personalized care plan for preventive services as well as general preventive health recommendations were provided to patient.     Verma Gobble, NP   05/03/2023

## 2023-05-17 ENCOUNTER — Encounter: Payer: Self-pay | Admitting: Nurse Practitioner

## 2023-05-17 ENCOUNTER — Non-Acute Institutional Stay: Payer: Self-pay | Admitting: Nurse Practitioner

## 2023-05-17 DIAGNOSIS — F419 Anxiety disorder, unspecified: Secondary | ICD-10-CM | POA: Diagnosis not present

## 2023-05-17 DIAGNOSIS — F03918 Unspecified dementia, unspecified severity, with other behavioral disturbance: Secondary | ICD-10-CM

## 2023-05-17 NOTE — Progress Notes (Signed)
 Location:  Other [facility].  Nursing Home Room Number: [facility] 202A Place of Service:  ALF [phone]) Gilbert Lab, NP  PCP: Valrie Gehrig, MD  Patient Care Team: Valrie Gehrig, MD as PCP - General [facility] Medicine)  Extended Emergency Contact Information Primary Emergency Contact: [place], [place], [place] [address] [place] Home Phone: [phone] Mobile Phone: [phone] Relation: Son Secondary Emergency Contact: [place], [place] [address] [place] Home Phone: [phone] Mobile Phone: [phone] Relation: Daughter  Goals of care: Advanced Directive information    05/17/2023    8:35 AM  Advanced Directives  Does Patient Have a Medical Advance Directive? Yes  Type of Advance Directive Out of facility DNR (pink MOST or yellow form)  Does patient want to make changes to medical advance directive? No - Patient declined     Chief Complaint  Patient presents with   Anxiety    Anxiety    HPI:  Pt is a 78 y.o. female seen today for an acute visit for increase in behaviors.  Staff reports increase in tremor, AIMs score increasing and noted increase in behaviors/anxiety.  She has been on seroquel  but does not feel like anxiety has been well controlled.  She has a lot of agitation that was previously controlled but noted increase with stopping buspar.  She wants to walk constantly but having more falls and needing more supervision.    Past Medical History:  Diagnosis Date   Arthritis    Cancer (HCC)    Dementia (HCC)    Hypertension    Retina disorder, right    Past Surgical History:  Procedure Laterality Date   ABDOMINAL HYSTERECTOMY     COLONOSCOPY N/A 10/10/2019   Procedure: COLONOSCOPY;  Surgeon: Toledo, Alphonsus Jeans, MD;  Location: [facility] ENDOSCOPY;  Service: Gastroenterology;  Laterality: N/A;   JOINT REPLACEMENT     TONSILLECTOMY      Allergies  Allergen Reactions   Erythromycin Rash     Outpatient Encounter Medications as of 05/17/2023  Medication Sig   acetaminophen (TYLENOL) 325 MG tablet Take 650 mg by mouth every 4 (four) hours as needed.   aspirin 81 MG chewable tablet Chew 81 mg by mouth every other day.   B Complex Vitamins (VITAMIN B COMPLEX PO) Give 1 tablet by mouth one time a day for supplement   busPIRone (BUSPAR) 5 MG tablet Take 5 mg by mouth daily.   calcium carbonate (CALCIUM 600) 600 MG TABS tablet Take 600 mg by mouth 2 (two) times daily with a meal.   Cholecalciferol 25 MCG (1000 UT) tablet Take 1,000 Units by mouth daily.   guaiFENesin (ROBITUSSIN) 100 MG/5ML liquid Take 30 mLs by mouth every 4 (four) hours as needed for cough or to loosen phlegm.   melatonin 5 MG TABS Take 5 mg by mouth at bedtime.   Multiple Vitamins-Minerals (MULTIVITAMIN ADULT EXTRA C PO) Take 1 tablet by mouth daily.   nystatin (MYCOSTATIN/NYSTOP) powder Apply 1 Application topically 2 (two) times daily. Under left breast for skin breakdown   polyethylene glycol (MIRALAX / GLYCOLAX) 17 g packet Take 17 g by mouth daily as needed.   QUEtiapine  (SEROQUEL ) 25 MG tablet Take 1 tablet (25 mg total) by mouth every morning.   QUEtiapine  (SEROQUEL ) 50 MG tablet Take 50 mg by mouth. At [facility]  Take 1.5 tablets by mouth at bedtime   losartan (COZAAR) 50  MG tablet Take 50 mg by mouth daily. (Patient not taking: Reported on 05/03/2023)   [DISCONTINUED] busPIRone (BUSPAR) 10 MG tablet Take 20 mg by mouth 2 (two) times daily. (Patient not taking: Reported on 05/17/2023)   [DISCONTINUED] cyproheptadine (PERIACTIN) 4 MG tablet Take 4 mg by mouth daily. (Patient not taking: Reported on 05/17/2023)   No facility-administered encounter medications on file as of 05/17/2023.    Review of Systems  Unable to perform ROS: Dementia    Immunization History  Administered Date(s) Administered   Influenza, High Dose Seasonal PF 09/27/2017, 08/22/2018, 10/11/2020, 10/21/2021   Influenza-Unspecified  10/29/2022   Moderna Covid-19 Fall Seasonal Vaccine 38yrs & older 11/13/2021   Moderna Covid-19 Vaccine Bivalent Booster 11yrs & up 04/13/2022   PFIZER Comirnaty(Gray Top)Covid-19 Tri-Sucrose Vaccine 01/17/2019, 02/07/2019, 10/14/2019   PPD Test 12/30/2020   Pfizer Covid-19 Vaccine Bivalent Booster 33yrs & up 10/11/2020   Pneumococcal Conjugate-13 02/23/2019   Pneumococcal Polysaccharide-23 03/26/2020   Tdap 04/01/2022   Unspecified SARS-COV-2 Vaccination 04/16/2023   Zoster Recombinant(Shingrix) 02/23/2019, 04/26/2022   Pertinent  Health Maintenance Due  Topic Date Due   INFLUENZA VACCINE  08/05/2023   DEXA SCAN  Completed   Colonoscopy  Discontinued      10/10/2019   10:54 AM 07/19/2022    3:02 PM 05/03/2023    8:04 AM  Fall Risk  Falls in the past year?  1 1  Was there an injury with Fall?  0 0  Fall Risk Category Calculator  2 2  (RETIRED) Patient Fall Risk Level Moderate fall risk    Patient at Risk for Falls Due to  History of fall(s) History of fall(s);Impaired mobility  Fall risk Follow up   Falls evaluation completed   Functional Status Survey:    Vitals:   05/17/23 0828  BP: 138/78  Pulse: 82  Resp: 16  Temp: 97.9 F (36.6 C)  SpO2: 97%  Weight: 214 lb 12.8 oz (97.4 kg)  Height: 5\' 4"  (1.626 m)   Body mass index is 36.87 kg/m. Physical Exam Constitutional:      Appearance: Normal appearance.  Pulmonary:     Effort: Pulmonary effort is normal.  Neurological:     Mental Status: She is alert. Mental status is at baseline. She is disoriented.     Motor: Tremor present.     Gait: Gait abnormal.  Psychiatric:        Mood and Affect: Mood normal.     Labs reviewed: No results for input(s): "NA", "K", "CL", "CO2", "GLUCOSE", "BUN", "CREATININE", "CALCIUM", "MG", "PHOS" in the last 8760 hours. No results for input(s): "AST", "ALT", "ALKPHOS", "BILITOT", "PROT", "ALBUMIN" in the last 8760 hours. No results for input(s): "WBC", "NEUTROABS", "HGB", "HCT",  "MCV", "PLT" in the last 8760 hours. No results found for: "TSH" No results found for: "HGBA1C" No results found for: "CHOL", "HDL", "LDLCALC", "LDLDIRECT", "TRIG", "CHOLHDL"  Significant Diagnostic Results in last 30 days:  No results found.  Assessment/Plan 1. Anxiety (Primary) Worsening anxiety, will restart buspar 5 mg PO BID  2. Dementia with behavioral disturbance (HCC) -ongoing, with more tremors at this time Will decrease seroquel  to 25 mg at noon and continue 75 at bedtime.     Judit Awad K. Denney Fisherman [facility] Adult Medicine [phone]

## 2023-05-18 LAB — BASIC METABOLIC PANEL WITH GFR
BUN: 25 — AB (ref 4–21)
CO2: 30 — AB (ref 13–22)
Chloride: 104 (ref 99–108)
Creatinine: 1 (ref 0.5–1.1)
Glucose: 71
Potassium: 3.9 meq/L (ref 3.5–5.1)
Sodium: 141 (ref 137–147)

## 2023-05-18 LAB — CBC AND DIFFERENTIAL
HCT: 36 (ref 36–46)
Hemoglobin: 11.6 — AB (ref 12.0–16.0)
Neutrophils Absolute: 3143
Platelets: 272 10*3/uL (ref 150–400)
WBC: 5.4

## 2023-05-18 LAB — COMPREHENSIVE METABOLIC PANEL WITH GFR
Calcium: 9.7 (ref 8.7–10.7)
eGFR: 57

## 2023-05-18 LAB — CBC: RBC: 3.69 — AB (ref 3.87–5.11)

## 2023-07-01 ENCOUNTER — Encounter: Payer: Self-pay | Admitting: Student

## 2023-07-01 ENCOUNTER — Non-Acute Institutional Stay: Payer: Self-pay | Admitting: Student

## 2023-07-01 DIAGNOSIS — I10 Essential (primary) hypertension: Secondary | ICD-10-CM

## 2023-07-01 DIAGNOSIS — F419 Anxiety disorder, unspecified: Secondary | ICD-10-CM

## 2023-07-01 DIAGNOSIS — F03918 Unspecified dementia, unspecified severity, with other behavioral disturbance: Secondary | ICD-10-CM

## 2023-07-01 NOTE — Progress Notes (Signed)
 Location:  Other [facility].  Nursing Home Room Number: [facility] Place of Service:  ALF [phone]) Provider:  Abdul Fine, MD  Patient Care Team: Abdul Fine, MD as PCP - General [facility] Medicine)  Extended Emergency Contact Information Primary Emergency Contact: [place], [place], [place] [address] [place] Home Phone: [phone] Mobile Phone: [phone] Relation: Son Secondary Emergency Contact: PENNI [place], [place] [address] [place] Home Phone: [phone] Mobile Phone: [phone] Relation: Daughter  Code Status:  DNR Goals of care: Advanced Directive information    05/17/2023    8:35 AM  Advanced Directives  Does Patient Have a Medical Advance Directive? Yes  Type of Advance Directive Out of facility DNR (pink MOST or yellow form)  Does patient want to make changes to medical advance directive? No - Patient declined     Chief Complaint  Patient presents with   Medical Management of Chronic Issues    Medical Management of Chronic Issues.     HPI:  Pt is a 78 y.o. female seen today for medical management of chronic diseases.    Patient has had increased anxiety and behaviors for the last few days.   Past Medical History:  Diagnosis Date   Arthritis    Cancer (HCC)    Dementia (HCC)    Hypertension    Retina disorder, right    Past Surgical History:  Procedure Laterality Date   ABDOMINAL HYSTERECTOMY     COLONOSCOPY N/A 10/10/2019   Procedure: COLONOSCOPY;  Surgeon: Toledo, Ladell POUR, MD;  Location: [facility] ENDOSCOPY;  Service: Gastroenterology;  Laterality: N/A;   JOINT REPLACEMENT     TONSILLECTOMY      Allergies  Allergen Reactions   Erythromycin Rash    Outpatient Encounter Medications as of 07/01/2023  Medication Sig   acetaminophen (TYLENOL) 325 MG tablet Take 650 mg by mouth every 4 (four) hours as needed.   aspirin 81 MG chewable tablet Chew 81 mg by mouth every other day.   B  Complex Vitamins (VITAMIN B COMPLEX PO) Give 1 tablet by mouth one time a day for supplement   busPIRone (BUSPAR) 5 MG tablet Take 5 mg by mouth daily. (Patient taking differently: Take 5 mg by mouth 2 (two) times daily.)   calcium carbonate (CALCIUM 600) 600 MG TABS tablet Take 600 mg by mouth 2 (two) times daily with a meal.   Cholecalciferol 25 MCG (1000 UT) tablet Take 1,000 Units by mouth daily.   guaiFENesin (ROBITUSSIN) 100 MG/5ML liquid Take 30 mLs by mouth every 4 (four) hours as needed for cough or to loosen phlegm.   melatonin 5 MG TABS Take 5 mg by mouth at bedtime.   Multiple Vitamins-Minerals (MULTIVITAMIN ADULT EXTRA C PO) Take 1 tablet by mouth daily.   nystatin (MYCOSTATIN/NYSTOP) powder Apply 1 Application topically 2 (two) times daily. Under left breast for skin breakdown   polyethylene glycol (MIRALAX / GLYCOLAX) 17 g packet Take 17 g by mouth daily as needed.   QUEtiapine  (SEROQUEL ) 25 MG tablet Take 1 tablet (25 mg total) by mouth every morning.   QUEtiapine  (SEROQUEL ) 50 MG tablet Take 1.5 tablets by mouth at bedtime   losartan (COZAAR) 50 MG tablet Take 50 mg by mouth daily. (Patient not taking: Reported on 07/01/2023)   No facility-administered encounter medications on file as of 07/01/2023.    Review of Systems  Immunization History  Administered Date(s) Administered   Influenza, High Dose Seasonal PF 09/27/2017, 08/22/2018, 10/11/2020, 10/21/2021   Influenza-Unspecified 10/29/2022   Moderna Covid-19 Fall Seasonal Vaccine 40yrs & older 11/13/2021   Moderna Covid-19 Vaccine Bivalent Booster 31yrs & up 04/13/2022   PFIZER Comirnaty(Gray Top)Covid-19 Tri-Sucrose Vaccine 01/17/2019, 02/07/2019, 10/14/2019   PPD Test 12/30/2020   Pfizer Covid-19 Vaccine Bivalent Booster 31yrs & up 10/11/2020   Pneumococcal Conjugate-13 02/23/2019   Pneumococcal Polysaccharide-23 03/26/2020   Tdap 04/01/2022   Unspecified SARS-COV-2 Vaccination 04/16/2023   Zoster  Recombinant(Shingrix) 02/23/2019, 04/26/2022   Pertinent  Health Maintenance Due  Topic Date Due   INFLUENZA VACCINE  08/05/2023   DEXA SCAN  Completed   Colonoscopy  Discontinued      10/10/2019   10:54 AM 07/19/2022    3:02 PM 05/03/2023    8:04 AM  Fall Risk  Falls in the past year?  1 1  Was there an injury with Fall?  0 0  Fall Risk Category Calculator  2 2  (RETIRED) Patient Fall Risk Level Moderate fall risk     Patient at Risk for Falls Due to  History of fall(s) History of fall(s);Impaired mobility  Fall risk Follow up   Falls evaluation completed     Data saved with a previous flowsheet row definition   Functional Status Survey:    Vitals:   07/01/23 1157 07/01/23 1210  BP: (!) 143/87 136/79  Pulse: 76   Temp: 98.7 F (37.1 C)   SpO2: 96%   Weight: 211 lb 6.4 oz (95.9 kg)   Height: 5' 4 (1.626 m)    Body mass index is 36.29 kg/m. Physical Exam Constitutional:      Appearance: She is obese.  Cardiovascular:     Rate and Rhythm: Normal rate.  Pulmonary:     Effort: Pulmonary effort is normal.  Neurological:     Mental Status: Mental status is at baseline. She is disoriented.     Labs reviewed: Recent Labs    05/18/23 0000  NA 141  K 3.9  CL 104  CO2 30*  BUN 25*  CREATININE 1.0  CALCIUM 9.7   No results for input(s): AST, ALT, ALKPHOS, BILITOT, PROT, ALBUMIN in the last 8760 hours. Recent Labs    05/18/23 0000  WBC 5.4  NEUTROABS 3,143.00  HGB 11.6*  HCT 36  PLT 272   No results found for: TSH No results found for: HGBA1C No results found for: CHOL, HDL, LDLCALC, LDLDIRECT, TRIG, CHOLHDL  Significant Diagnostic Results in last 30 days:  No results found.  Assessment/Plan Anxiety  Dementia with behavioral disturbance (HCC)  Primary hypertension  Morbid obesity (HCC) Patient has had increased anxiety and unable to redirect x2 days. Discussed non-pharmacological management with nursing. Will plan to  increase Buspar to 10 mg TID. BP well-controlled, no changes at this time.    Family/ staff Communication: nursing n Labs/tests ordered:  none

## 2023-08-09 ENCOUNTER — Encounter: Payer: Self-pay | Admitting: Nurse Practitioner

## 2023-08-09 ENCOUNTER — Non-Acute Institutional Stay: Payer: Self-pay | Admitting: Nurse Practitioner

## 2023-08-09 DIAGNOSIS — F03918 Unspecified dementia, unspecified severity, with other behavioral disturbance: Secondary | ICD-10-CM

## 2023-08-09 DIAGNOSIS — F419 Anxiety disorder, unspecified: Secondary | ICD-10-CM | POA: Diagnosis not present

## 2023-08-09 DIAGNOSIS — I10 Essential (primary) hypertension: Secondary | ICD-10-CM

## 2023-08-09 NOTE — Progress Notes (Signed)
 Location:  [facility].  Nursing Home Room Number: [facility] Place of Service:  ALF [phone]) Kimberly An, NP  PCP: Abdul Fine, MD  Patient Care Team: Abdul Fine, MD as PCP - General [facility] Medicine)  Extended Emergency Contact Information Primary Emergency Contact: [place], [place], [place] [address] [place] Home Phone: [phone] Mobile Phone: [phone] Relation: Son Secondary Emergency Contact: PENNI [place], [place] [address] [place] Home Phone: [phone] Mobile Phone: [phone] Relation: Daughter  Goals of care: Advanced Directive information    08/09/2023    1:11 PM  Advanced Directives  Does Patient Have a Medical Advance Directive? Yes  Type of Advance Directive Out of facility DNR (pink MOST or yellow form)  Does patient want to make changes to medical advance directive? No - Patient declined     Chief Complaint  Patient presents with   Restlessness    Restlessness.     HPI:  Pt is a 78 y.o. female seen today for Small acute visit for Restlessness. Pt with dementia and has had worsening agitation and anxiety.  She is currently taking buspar 15 mg TID due to anxiety and seroquel  50 mg daily due to dementia with behaviors.  Staff reports she gets very worked up due to not being able to ambulate independently- she is high fall risk and needs assistance and walker with ambulation. Buspar most recently increased but continues to be unable to redirect and unsettled.   Past Medical History:  Diagnosis Date   Arthritis    Cancer (HCC)    Dementia (HCC)    Hypertension    Retina disorder, right    Past Surgical History:  Procedure Laterality Date   ABDOMINAL HYSTERECTOMY     COLONOSCOPY N/A 10/10/2019   Procedure: COLONOSCOPY;  Surgeon: Toledo, Ladell POUR, MD;  Location: [facility] ENDOSCOPY;  Service: Gastroenterology;  Laterality: N/A;   JOINT REPLACEMENT     TONSILLECTOMY       Allergies  Allergen Reactions   Erythromycin Rash    Outpatient Encounter Medications as of 08/09/2023  Medication Sig   acetaminophen (TYLENOL) 325 MG tablet Take 650 mg by mouth every 4 (four) hours as needed.   aspirin 81 MG chewable tablet Chew 81 mg by mouth every other day.   B Complex Vitamins (VITAMIN B COMPLEX PO) Give 1 tablet by mouth one time a day for supplement   busPIRone (BUSPAR) 5 MG tablet Take 5 mg by mouth daily. (Patient taking differently: Take 15 mg by mouth 3 (three) times daily.)   calcium carbonate (CALCIUM 600) 600 MG TABS tablet Take 600 mg by mouth 2 (two) times daily with a meal.   Cholecalciferol 25 MCG (1000 UT) tablet Take 1,000 Units by mouth daily.   guaiFENesin (ROBITUSSIN) 100 MG/5ML liquid Take 30 mLs by mouth every 4 (four) hours as needed for cough or to loosen phlegm.   melatonin 5 MG TABS Take 5 mg by mouth at bedtime.   Multiple Vitamins-Minerals (MULTIVITAMIN ADULT EXTRA C PO) Take 1 tablet by mouth daily.   nystatin (MYCOSTATIN/NYSTOP) powder Apply 1 Application topically 2 (two) times daily. Under left breast for skin breakdown   polyethylene glycol (MIRALAX / GLYCOLAX) 17 g packet Take 17 g by mouth daily as needed.   QUEtiapine  (SEROQUEL ) 25 MG tablet Take 1 tablet (25 mg total) by mouth every morning. (Patient taking differently: Take  50 mg by mouth daily.)   QUEtiapine  (SEROQUEL ) 50 MG tablet Take 1.5 tablets by mouth at bedtime   sertraline (ZOLOFT) 25 MG tablet Take 25 mg by mouth daily.   losartan (COZAAR) 50 MG tablet Take 50 mg by mouth daily. (Patient not taking: Reported on 08/09/2023)   No facility-administered encounter medications on file as of 08/09/2023.    Review of Systems  Unable to perform ROS: Dementia    Immunization History  Administered Date(s) Administered   Influenza, High Dose Seasonal PF 09/27/2017, 08/22/2018, 10/11/2020, 10/21/2021   Influenza-Unspecified 10/29/2022   Moderna Covid-19 Fall Seasonal Vaccine  64yrs & older 11/13/2021   Moderna Covid-19 Vaccine Bivalent Booster 37yrs & up 04/13/2022   PFIZER Comirnaty(Gray Top)Covid-19 Tri-Sucrose Vaccine 01/17/2019, 02/07/2019, 10/14/2019   PPD Test 12/30/2020   Pfizer Covid-19 Vaccine Bivalent Booster 40yrs & up 10/11/2020   Pneumococcal Conjugate-13 02/23/2019   Pneumococcal Polysaccharide-23 03/26/2020   Tdap 04/01/2022   Unspecified SARS-COV-2 Vaccination 04/16/2023   Zoster Recombinant(Shingrix) 02/23/2019, 04/26/2022   Pertinent  Health Maintenance Due  Topic Date Due   INFLUENZA VACCINE  08/05/2023   DEXA SCAN  Completed   Colonoscopy  Discontinued      10/10/2019   10:54 AM 07/19/2022    3:02 PM 05/03/2023    8:04 AM  Fall Risk  Falls in the past year?  1 1  Was there Small injury with Fall?  0 0  Fall Risk Category Calculator  2 2  (RETIRED) Patient Fall Risk Level Moderate fall risk     Patient at Risk for Falls Due to  History of fall(s) History of fall(s);Impaired mobility  Fall risk Follow up   Falls evaluation completed     Data saved with a previous flowsheet row definition   Functional Status Survey:    Vitals:   08/09/23 1304  BP: 137/84  Pulse: 69  Resp: 17  Temp: 98.7 F (37.1 C)  SpO2: 96%  Weight: 213 lb (96.6 kg)  Height: 5' 4 (1.626 m)   Body mass index is 36.56 kg/m. Physical Exam Constitutional:      Appearance: Normal appearance.  Pulmonary:     Effort: Pulmonary effort is normal.  Neurological:     Mental Status: She is alert. Mental status is at baseline.  Psychiatric:        Mood and Affect: Mood normal.     Labs reviewed: Recent Labs    05/18/23 0000  NA 141  K 3.9  CL 104  CO2 30*  BUN 25*  CREATININE 1.0  CALCIUM 9.7   No results for input(s): AST, ALT, ALKPHOS, BILITOT, PROT, ALBUMIN in the last 8760 hours. Recent Labs    05/18/23 0000  WBC 5.4  NEUTROABS 3,143.00  HGB 11.6*  HCT 36  PLT 272   No results found for: TSH No results found for:  HGBA1C No results found for: CHOL, HDL, LDLCALC, LDLDIRECT, TRIG, CHOLHDL  Significant Diagnostic Results in last 30 days:  No results found.  Assessment/Plan   1. Dementia with behavioral disturbance (HCC) (Primary) Ongoing behaviors, continues on seroquel  with redirection per nursing.   2. Anxiety Worsening anxiety despite buspar increase, will start zoloft 25 mg daily and then increase to 50 mg after 2 weeks.  3. Primary hypertension Blood pressure well controlled, goal bp <140/90 Continue current medications and dietary modifications follow metabolic panel    Jerol Rufener K. Caro BODILY [facility] Adult Medicine [phone]

## 2023-08-24 ENCOUNTER — Encounter: Payer: Self-pay | Admitting: Student

## 2023-08-24 NOTE — Progress Notes (Signed)
 This encounter was created in error - please disregard.

## 2023-09-08 LAB — BASIC METABOLIC PANEL WITH GFR
BUN: 22 — AB (ref 4–21)
CO2: 26 — AB (ref 13–22)
Chloride: 106 (ref 99–108)
Creatinine: 0.9 (ref 0.5–1.1)
Glucose: 79
Potassium: 4.1 meq/L (ref 3.5–5.1)
Sodium: 142 (ref 137–147)

## 2023-09-08 LAB — COMPREHENSIVE METABOLIC PANEL WITH GFR
Calcium: 9.6 (ref 8.7–10.7)
eGFR: 65

## 2023-09-13 ENCOUNTER — Non-Acute Institutional Stay: Payer: Self-pay | Admitting: Nurse Practitioner

## 2023-09-13 ENCOUNTER — Encounter: Payer: Self-pay | Admitting: Nurse Practitioner

## 2023-09-13 DIAGNOSIS — G4709 Other insomnia: Secondary | ICD-10-CM

## 2023-09-13 DIAGNOSIS — I10 Essential (primary) hypertension: Secondary | ICD-10-CM | POA: Diagnosis not present

## 2023-09-13 DIAGNOSIS — F419 Anxiety disorder, unspecified: Secondary | ICD-10-CM

## 2023-09-13 DIAGNOSIS — F22 Delusional disorders: Secondary | ICD-10-CM

## 2023-09-13 DIAGNOSIS — F03918 Unspecified dementia, unspecified severity, with other behavioral disturbance: Secondary | ICD-10-CM

## 2023-09-13 DIAGNOSIS — M1991 Primary osteoarthritis, unspecified site: Secondary | ICD-10-CM

## 2023-09-13 DIAGNOSIS — R296 Repeated falls: Secondary | ICD-10-CM

## 2023-09-13 NOTE — Progress Notes (Unsigned)
 Location:  Other [facility].  Nursing Home Room Number: [facility] Place of Service:  ALF [phone]) [name], NP  PCP: Abdul Fine, MD  Patient Care Team: Abdul Fine, MD as PCP - General [facility])  Extended Emergency Contact Information Primary Emergency Contact: Malisha, [place], [place] [address] [place] Home Phone: [phone] Mobile Phone: [phone] Relation: Son Secondary Emergency Contact: PENNI [place], [place] [address] [place] Home Phone: [phone] Mobile Phone: [phone] Relation: Daughter  Goals of care: Advanced Directive information    08/09/2023    1:11 PM  Advanced Directives  Does Patient Have a Medical Advance Directive? Yes  Type of Advance Directive Out of facility DNR (pink MOST or yellow form)  Does patient want to make changes to medical advance directive? No - Patient declined     Chief Complaint  Patient presents with   Medical Management of Chronic Issues    Medical Management of Chronic Issues.     HPI:  Pt is a 78 y.o. female seen today for medical management of chronic disease. Pt with hx fo dementia in memory care at [facility]. She has had a lot of recent behaviors being managed with medication management and nursing interventions.  She has been doing very well on current regimen.  No recent falls No complaints of pain from staff Reports she is eating well.     Past Medical History:  Diagnosis Date   Arthritis    Cancer (HCC)    Dementia (HCC)    Hypertension    Retina disorder, right    Past Surgical History:  Procedure Laterality Date   ABDOMINAL HYSTERECTOMY     COLONOSCOPY N/A 10/10/2019   Procedure: COLONOSCOPY;  Surgeon: Toledo, Ladell POUR, MD;  Location: [facility] ENDOSCOPY;  Service: Gastroenterology;  Laterality: N/A;   JOINT REPLACEMENT     TONSILLECTOMY      Allergies  Allergen Reactions   Erythromycin Rash    Outpatient Encounter  Medications as of 09/13/2023  Medication Sig   acetaminophen (TYLENOL) 325 MG tablet Take 650 mg by mouth every 4 (four) hours as needed.   acetaminophen (TYLENOL) 500 MG tablet Take 1,000 mg by mouth 3 (three) times daily.   aspirin 81 MG chewable tablet Chew 81 mg by mouth every other day.   B Complex Vitamins (VITAMIN B COMPLEX PO) Give 1 tablet by mouth one time a day for supplement   busPIRone (BUSPAR) 5 MG tablet Take 15 mg by mouth 3 (three) times daily. (Patient taking differently: Take 10 mg by mouth 4 (four) times daily.)   calcium carbonate (CALCIUM 600) 600 MG TABS tablet Take 600 mg by mouth 2 (two) times daily with a meal.   Cholecalciferol 25 MCG (1000 UT) tablet Take 1,000 Units by mouth daily.   guaiFENesin (ROBITUSSIN) 100 MG/5ML liquid Take 30 mLs by mouth every 4 (four) hours as needed for cough or to loosen phlegm.   melatonin 5 MG TABS Take 5 mg by mouth at bedtime.   Multiple Vitamins-Minerals (MULTIVITAMIN ADULT EXTRA C PO) Take 1 tablet by mouth daily.   nystatin (MYCOSTATIN/NYSTOP) powder Apply 1 Application topically 2 (two) times daily. Under left breast for skin breakdown   OLANZapine (ZYPREXA) 10 MG tablet Take 10 mg by mouth at bedtime.   polyethylene glycol (MIRALAX / GLYCOLAX) 17 g packet Take 17 g by mouth daily as needed.  sertraline (ZOLOFT) 25 MG tablet Take 25 mg by mouth daily. (Patient taking differently: Take 100 mg by mouth daily.)   QUEtiapine  (SEROQUEL ) 50 MG tablet Take 1.5 tablets by mouth at bedtime (Patient not taking: Reported on 09/13/2023)   QUEtiapine  (SEROQUEL ) 50 MG tablet Take 50 mg by mouth daily. (Patient not taking: Reported on 09/13/2023)   No facility-administered encounter medications on file as of 09/13/2023.    Review of Systems  Unable to perform ROS: Dementia     Immunization History  Administered Date(s) Administered   INFLUENZA, HIGH DOSE SEASONAL PF 09/27/2017, 08/22/2018, 10/11/2020, 10/21/2021   Influenza-Unspecified  10/29/2022   Moderna Covid-19 Fall Seasonal Vaccine 73yrs & older 11/13/2021   Moderna Covid-19 Vaccine Bivalent Booster 7yrs & up 04/13/2022   PFIZER Comirnaty(Gray Top)Covid-19 Tri-Sucrose Vaccine 01/17/2019, 02/07/2019, 10/14/2019   PPD Test 12/30/2020   Pfizer Covid-19 Vaccine Bivalent Booster 65yrs & up 10/11/2020   Pneumococcal Conjugate-13 02/23/2019   Pneumococcal Polysaccharide-23 03/26/2020   Tdap 04/01/2022   Unspecified SARS-COV-2 Vaccination 04/16/2023   Zoster Recombinant(Shingrix) 02/23/2019, 04/26/2022   Pertinent  Health Maintenance Due  Topic Date Due   Influenza Vaccine  08/05/2023   DEXA SCAN  Completed   Colonoscopy  Discontinued      10/10/2019   10:54 AM 07/19/2022    3:02 PM 05/03/2023    8:04 AM  Fall Risk  Falls in the past year?  1 1  Was there an injury with Fall?  0 0  Fall Risk Category Calculator  2 2  (RETIRED) Patient Fall Risk Level Moderate fall risk     Patient at Risk for Falls Due to  History of fall(s) History of fall(s);Impaired mobility  Fall risk Follow up   Falls evaluation completed     Data saved with a previous flowsheet row definition   Functional Status Survey:    Vitals:   09/13/23 0854 09/13/23 0902  BP: (!) 150/80 (!) 147/79  Pulse: 63   Resp: 19   Temp: 99.1 F (37.3 C)   SpO2: 95%   Weight: 204 lb (92.5 kg)   Height: 5' 4 (1.626 m)    Body mass index is 35.02 kg/m. Physical Exam Constitutional:      General: She is not in acute distress.    Appearance: She is well-developed. She is not diaphoretic.  HENT:     Head: Normocephalic and atraumatic.     Mouth/Throat:     Pharynx: No oropharyngeal exudate.  Eyes:     Conjunctiva/sclera: Conjunctivae normal.     Pupils: Pupils are equal, round, and reactive to light.  Cardiovascular:     Rate and Rhythm: Normal rate and regular rhythm.     Heart sounds: Normal heart sounds.  Pulmonary:     Effort: Pulmonary effort is normal.     Breath sounds: Normal breath  sounds.  Abdominal:     General: Bowel sounds are normal.     Palpations: Abdomen is soft.  Musculoskeletal:     Cervical back: Normal range of motion and neck supple.     Right lower leg: No edema.     Left lower leg: No edema.  Skin:    General: Skin is warm and dry.  Neurological:     Mental Status: She is alert.  Psychiatric:        Mood and Affect: Mood normal.     Labs reviewed: Recent Labs    05/18/23 0000  NA 141  K 3.9  CL 104  CO2 30*  BUN 25*  CREATININE 1.0  CALCIUM 9.7   No results for input(s): AST, ALT, ALKPHOS, BILITOT, PROT, ALBUMIN in the last 8760 hours. Recent Labs    05/18/23 0000  WBC 5.4  NEUTROABS 3,143.00  HGB 11.6*  HCT 36  PLT 272   No results found for: TSH No results found for: HGBA1C No results found for: CHOL, HDL, LDLCALC, LDLDIRECT, TRIG, CHOLHDL  Significant Diagnostic Results in last 30 days:  No results found.  Assessment/Plan Anxiety Recently buspar has been adjusted to 10 mg QiD which has worked better for her. Continues also on zoloft daily Will continue current regimen.   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   Morbid obesity (HCC) BMI of 35. She is limited in activity due to dementia   Other insomnia Stable at this time.      Jessica K. Caro BODILY [facility] [phone]

## 2023-09-14 DIAGNOSIS — M1991 Primary osteoarthritis, unspecified site: Secondary | ICD-10-CM | POA: Insufficient documentation

## 2023-09-14 NOTE — Assessment & Plan Note (Signed)
 Stable at this time, continues on zyprexa

## 2023-09-14 NOTE — Assessment & Plan Note (Signed)
>>  ASSESSMENT AND PLAN FOR DEMENTIA WITH BEHAVIORAL DISTURBANCE (HCC) WRITTEN ON 09/14/2023  5:05 PM BY EUBANKS, JESSICA K, NP  Ongoing but stable, continues on zyprexa

## 2023-09-14 NOTE — Assessment & Plan Note (Signed)
 Ongoing but stable, continues on zyprexa

## 2023-09-14 NOTE — Assessment & Plan Note (Signed)
 BMI of 35. She is limited in activity due to dementia

## 2023-09-14 NOTE — Assessment & Plan Note (Signed)
 Stable at this time

## 2023-09-14 NOTE — Assessment & Plan Note (Signed)
 Blood pressure is elevated but not currently on medication Will monitor at this time and make adjustments if continues to be elevated.

## 2023-09-14 NOTE — Assessment & Plan Note (Signed)
 Recently buspar has been adjusted to 10 mg QiD which has worked better for her. Continues also on zoloft daily Will continue current regimen.

## 2023-10-26 ENCOUNTER — Non-Acute Institutional Stay: Payer: Self-pay | Admitting: Internal Medicine

## 2023-10-26 ENCOUNTER — Encounter: Payer: Self-pay | Admitting: Internal Medicine

## 2023-10-26 DIAGNOSIS — R4182 Altered mental status, unspecified: Secondary | ICD-10-CM | POA: Insufficient documentation

## 2023-10-26 DIAGNOSIS — G301 Alzheimer's disease with late onset: Secondary | ICD-10-CM

## 2023-10-26 DIAGNOSIS — F02B3 Dementia in other diseases classified elsewhere, moderate, with mood disturbance: Secondary | ICD-10-CM | POA: Diagnosis not present

## 2023-10-26 DIAGNOSIS — F03918 Unspecified dementia, unspecified severity, with other behavioral disturbance: Secondary | ICD-10-CM

## 2023-10-26 DIAGNOSIS — Z66 Do not resuscitate: Secondary | ICD-10-CM | POA: Diagnosis not present

## 2023-10-26 LAB — COMPREHENSIVE METABOLIC PANEL WITH GFR
Calcium: 9.6 (ref 8.7–10.7)
eGFR: 63

## 2023-10-26 LAB — BASIC METABOLIC PANEL WITH GFR
BUN: 36 — AB (ref 4–21)
CO2: 27 — AB (ref 13–22)
Chloride: 105 (ref 99–108)
Creatinine: 0.9 (ref 0.5–1.1)
Glucose: 99
Potassium: 3.8 meq/L (ref 3.5–5.1)
Sodium: 140 (ref 137–147)

## 2023-10-26 LAB — CBC AND DIFFERENTIAL
HCT: 33 — AB (ref 36–46)
Hemoglobin: 10.3 — AB (ref 12.0–16.0)
Neutrophils Absolute: 3310
Platelets: 250 K/uL (ref 150–400)
WBC: 5.4

## 2023-10-26 LAB — CBC: RBC: 3.26 — AB (ref 3.87–5.11)

## 2023-10-26 NOTE — Assessment & Plan Note (Addendum)
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  Patient remains on BuSpar 10 mg 4 times a day, Zyprexa 10 mg nightly, Zoloft 100 mg daily.       >>ASSESSMENT AND PLAN FOR DEMENTIA WITH BEHAVIORAL DISTURBANCE (HCC) WRITTEN ON 10/26/2023 11:22 AM BY Celia Friedland, DO  Chronic and stable.  Patient remains on BuSpar 10 mg 4 times a day, Zyprexa 10 mg nightly, Zoloft 100 mg daily.

## 2023-10-26 NOTE — Assessment & Plan Note (Addendum)
 Chronic and stable.  Patient remains on BuSpar 10 mg 4 times a day, Zyprexa 10 mg nightly, Zoloft 100 mg daily.

## 2023-10-26 NOTE — Progress Notes (Signed)
 [facility] Acute Care Progress Note    Location:  Other [facility].  Nursing Home Room Number: [facility] Place of Service:  ALF [phone])   PCP: Laurence Locus, DO   Patient Care Team: Laurence Locus, DO as PCP - General (Internal Medicine)   Extended Emergency Contact Information Primary Emergency Contact: [place], [place], [place] [address] [place] Home Phone: [phone] Mobile Phone: [phone] Relation: Son Secondary Emergency Contact: PENNI [place], [place] [address] [place] Home Phone: [phone] Mobile Phone: [phone] Relation: Daughter   Goals of care: Advanced Directive information    10/26/2023    8:41 AM  Advanced Directives  Does Patient Have a Medical Advance Directive? Yes  Type of Advance Directive Out of facility DNR (pink MOST or yellow form)  Does patient want to make changes to medical advance directive? No - Patient declined     CODE STATUS: Do Not Resuscitate (DNR)   Chief Complaint  Patient presents with   Altered Mental Status    Altered Mental Status.      HPI: Pt is a 78 y.o. female seen today for an acute visit for Altered Mental Status.  Per nursing report, the patient was altered yesterday evening.  She was more stuporous.  Apparently the family noticed the patient also being somewhat altered.  She was able to eat her dinner yesterday without difficulty.  By this morning, she was already sitting up in the TV room.  She was easily arousable.  There is no focal deficits.  She is pleasantly demented.  She had bilateral hand grip strength that was 4 out of 5 bilaterally.  She was able to keep her right and left leg extended at the knee against gravity.  Labs were just drawn this morning.  Nursing is going to get her cath UA sample later this morning.     Past Medical History:  Diagnosis Date   Arthritis    Cancer (HCC)    Dementia (HCC)    Hypertension    Retina disorder, right     Past Surgical History:  Procedure Laterality Date   ABDOMINAL HYSTERECTOMY     COLONOSCOPY N/A 10/10/2019   Procedure: COLONOSCOPY;  Surgeon: Toledo, Ladell POUR, MD;  Location: [facility] ENDOSCOPY;  Service: Gastroenterology;  Laterality: N/A;   JOINT REPLACEMENT     TONSILLECTOMY       Allergies  Allergen Reactions   Erythromycin Rash     Outpatient Encounter Medications as of 10/26/2023  Medication Sig   acetaminophen (TYLENOL) 325 MG tablet Take 650 mg by mouth every 4 (four) hours as needed.   acetaminophen (TYLENOL) 500 MG tablet Take 1,000 mg by mouth 3 (three) times daily.   aspirin 81 MG chewable tablet Chew 81 mg by mouth every other day.   B Complex Vitamins (VITAMIN B COMPLEX PO) Give 1 tablet by mouth one time a day for supplement   busPIRone (BUSPAR) 5 MG tablet Take 10 mg by mouth in the morning, at noon, in the evening, and at bedtime.   calcium carbonate (CALCIUM 600) 600 MG TABS tablet Take 600 mg by mouth 2 (two) times daily with a meal.   Cholecalciferol 25 MCG (1000 UT) tablet Take 1,000 Units by mouth daily.   guaiFENesin (ROBITUSSIN) 100 MG/5ML liquid Take 30 mLs by mouth every 4 (four) hours as needed for cough or to loosen phlegm.  melatonin 5 MG TABS Take 5 mg by mouth at bedtime.   Multiple Vitamins-Minerals (MULTIVITAMIN ADULT EXTRA C PO) Take 1 tablet by mouth daily.   nystatin (MYCOSTATIN/NYSTOP) powder Apply 1 Application topically 2 (two) times daily. Under left breast for skin breakdown   OLANZapine (ZYPREXA) 10 MG tablet Take 10 mg by mouth at bedtime.   polyethylene glycol (MIRALAX / GLYCOLAX) 17 g packet Take 17 g by mouth daily as needed.   sertraline (ZOLOFT) 100 MG tablet Take 100 mg by mouth daily.   [DISCONTINUED] sertraline (ZOLOFT) 25 MG tablet Take 25 mg by mouth daily. (Patient not taking: Reported on 10/26/2023)   No facility-administered encounter medications on file as of 10/26/2023.     Review of Systems  Unable to perform ROS:  Dementia     Immunization History  Administered Date(s) Administered   INFLUENZA, HIGH DOSE SEASONAL PF 09/27/2017, 08/22/2018, 10/11/2020, 10/21/2021   Influenza-Unspecified 10/29/2022   Moderna Covid-19 Fall Seasonal Vaccine 72yrs & older 11/13/2021   Moderna Covid-19 Vaccine Bivalent Booster 67yrs & up 04/13/2022   PFIZER Comirnaty(Gray Top)Covid-19 Tri-Sucrose Vaccine 01/17/2019, 02/07/2019, 10/14/2019   PPD Test 12/30/2020   Pfizer Covid-19 Vaccine Bivalent Booster 64yrs & up 10/11/2020   Pneumococcal Conjugate-13 02/23/2019   Pneumococcal Polysaccharide-23 03/26/2020   Tdap 04/01/2022   Unspecified SARS-COV-2 Vaccination 04/16/2023   Zoster Recombinant(Shingrix) 02/23/2019, 04/26/2022   Pertinent  Health Maintenance Due  Topic Date Due   Influenza Vaccine  08/05/2023   DEXA SCAN  Completed   Colonoscopy  Discontinued      10/10/2019   10:54 AM 07/19/2022    3:02 PM 05/03/2023    8:04 AM  Fall Risk  Falls in the past year?  1 1  Was there an injury with Fall?  0 0  Fall Risk Category Calculator  2 2  (RETIRED) Patient Fall Risk Level Moderate fall risk     Patient at Risk for Falls Due to  History of fall(s) History of fall(s);Impaired mobility  Fall risk Follow up   Falls evaluation completed     Data saved with a previous flowsheet row definition   Functional Status Survey:     Vitals:   10/26/23 0834  BP: 132/82  Pulse: 67  Resp: 18  Temp: (!) 96.3 F (35.7 C)  SpO2: 97%  Weight: 208 lb (94.3 kg)  Height: 5' 4 (1.626 m)   Body mass index is 35.7 kg/m. Physical Exam Vitals and nursing note reviewed.  Constitutional:      General: She is not in acute distress.    Appearance: She is obese. She is not toxic-appearing.     Comments: Easily Arousable  HENT:     Head: Normocephalic and atraumatic.     Nose: Nose normal.  Eyes:     General: No scleral icterus. Cardiovascular:     Rate and Rhythm: Normal rate and regular rhythm.  Pulmonary:      Effort: Pulmonary effort is normal. No respiratory distress.  Abdominal:     General: Abdomen is protuberant. Bowel sounds are normal.     Palpations: Abdomen is soft.  Musculoskeletal:     Comments: Has bilateral compression stockings on her lower legs  Skin:    General: Skin is warm and dry.     Capillary Refill: Capillary refill takes less than 2 seconds.  Neurological:     General: No focal deficit present.     Mental Status: She is disoriented.     Comments: Answer to her  name. Not oriented to place or time.      Labs reviewed: Recent Labs    05/18/23 0000 09/08/23 0000  NA 141 142  K 3.9 4.1  CL 104 106  CO2 30* 26*  BUN 25* 22*  CREATININE 1.0 0.9  CALCIUM 9.7 9.6    Recent Labs    05/18/23 0000  WBC 5.4  NEUTROABS 3,143.00  HGB 11.6*  HCT 36  PLT 272   Assessment & Plan Altered mental status, unspecified altered mental status type Patient seen and examined.  She is awake again.  Her altered level of consciousness is only transient.  CBC, BMP, UA and culture was ordered.  Unclear the cause of her altered mental status yesterday but it seems to have resolved now.  Continue to monitor.     Dementia with behavioral disturbance (HCC) Chronic and stable.  Patient remains on BuSpar 10 mg 4 times a day, Zyprexa 10 mg nightly, Zoloft 100 mg daily.     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  Patient remains on BuSpar 10 mg 4 times a day, Zyprexa 10 mg nightly, Zoloft 100 mg daily.     DNR (do not resuscitate) Patient is DNR      Camellia Door, DO  [facility] [phone]

## 2023-10-26 NOTE — Assessment & Plan Note (Addendum)
Patient is DNR 

## 2023-10-26 NOTE — Assessment & Plan Note (Addendum)
 Patient seen and examined.  She is awake again.  Her altered level of consciousness is only transient.  CBC, BMP, UA and culture was ordered.  Unclear the cause of her altered mental status yesterday but it seems to have resolved now.  Continue to monitor.

## 2023-11-10 LAB — BASIC METABOLIC PANEL WITH GFR
BUN: 36 — AB (ref 4–21)
CO2: 26 — AB (ref 13–22)
Chloride: 106 (ref 99–108)
Creatinine: 0.9 (ref 0.5–1.1)
Glucose: 145
Potassium: 4 meq/L (ref 3.5–5.1)
Sodium: 143 (ref 137–147)

## 2023-11-10 LAB — COMPREHENSIVE METABOLIC PANEL WITH GFR
Albumin: 4 (ref 3.5–5.0)
Calcium: 9.7 (ref 8.7–10.7)
Globulin: 2.7
eGFR: 61

## 2023-11-10 LAB — HEPATIC FUNCTION PANEL
ALT: 12 U/L (ref 7–35)
AST: 17 (ref 13–35)
Alkaline Phosphatase: 50 (ref 25–125)
Bilirubin, Total: 0.3

## 2023-11-10 LAB — CBC AND DIFFERENTIAL
HCT: 33 — AB (ref 36–46)
Hemoglobin: 10.7 — AB (ref 12.0–16.0)
Neutrophils Absolute: 4415
Platelets: 245 K/uL (ref 150–400)
WBC: 6.7

## 2023-11-10 LAB — CBC: RBC: 3.41 — AB (ref 3.87–5.11)

## 2023-11-22 ENCOUNTER — Encounter: Payer: Self-pay | Admitting: Internal Medicine

## 2023-11-22 ENCOUNTER — Non-Acute Institutional Stay (SKILLED_NURSING_FACILITY): Payer: Self-pay | Admitting: Internal Medicine

## 2023-11-22 DIAGNOSIS — E66812 Obesity, class 2: Secondary | ICD-10-CM

## 2023-11-22 DIAGNOSIS — F02B3 Dementia in other diseases classified elsewhere, moderate, with mood disturbance: Secondary | ICD-10-CM | POA: Diagnosis not present

## 2023-11-22 DIAGNOSIS — Z5181 Encounter for therapeutic drug level monitoring: Secondary | ICD-10-CM | POA: Diagnosis not present

## 2023-11-22 DIAGNOSIS — G4709 Other insomnia: Secondary | ICD-10-CM

## 2023-11-22 DIAGNOSIS — I10 Essential (primary) hypertension: Secondary | ICD-10-CM

## 2023-11-22 DIAGNOSIS — F419 Anxiety disorder, unspecified: Secondary | ICD-10-CM | POA: Diagnosis not present

## 2023-11-22 DIAGNOSIS — F22 Delusional disorders: Secondary | ICD-10-CM

## 2023-11-22 DIAGNOSIS — G301 Alzheimer's disease with late onset: Secondary | ICD-10-CM

## 2023-11-22 DIAGNOSIS — F03918 Unspecified dementia, unspecified severity, with other behavioral disturbance: Secondary | ICD-10-CM

## 2023-11-22 NOTE — Assessment & Plan Note (Addendum)
 11/22/23 continue with melatonin 5 mg nightly along with Zyprexa 10 mg nightly

## 2023-11-22 NOTE — Assessment & Plan Note (Addendum)
 11/22/23 BP currently stable without any blood pressure medications.

## 2023-11-22 NOTE — Assessment & Plan Note (Addendum)
Body mass index is 35.03 kg/m.

## 2023-11-22 NOTE — Progress Notes (Signed)
 [facility] Routine Visit Progress Note    Location:  Other Nursing Home Room Number: [facility], [place] - 202 Place of Service:  SNF (31)   Laurence Locus, DO   Patient Care Team: Laurence Locus, DO as PCP - General (Internal Medicine)   Extended Emergency Contact Information Primary Emergency Contact: A[place], [place], [place] [address] [place] Home Phone: [phone] Mobile Phone: [phone] Relation: Son Secondary Emergency Contact: PENNI ELIDA [place], [place] [address] [place] Home Phone: [phone] Mobile Phone: [phone] Relation: Daughter   Goals of care: Advanced Directive information    10/26/2023    8:41 AM  Advanced Directives  Does Patient Have a Medical Advance Directive? Yes  Type of Advance Directive Out of facility DNR (pink MOST or yellow form)  Does patient want to make changes to medical advance directive? No - Patient declined    CODE STATUS: Full Code Do Not Resuscitate (DNR)   Chief Complaint  Patient presents with   Medical Management of Chronic Issues    Routine medical care visit     HPI: Pt is a 78 y.o. female seen today for medical management of chronic disease.  Nursing reports no new issues with Kimberly Small.  She is doing well on BuSpar 10 mg 4 times a day.  Patient still paces at night.  She still uses Zyprexa at night.  No outbursts noted.  No further mental status changes.  No other nursing concerns.   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   acetaminophen (TYLENOL) 500 MG tablet Take 1,000 mg by mouth 3 (three) times daily.   aspirin 81 MG chewable tablet Chew 81 mg by mouth every other day.   B Complex Vitamins (VITAMIN B COMPLEX PO) Give 1 tablet by mouth one time a day for supplement   busPIRone (BUSPAR) 5 MG tablet Take 10 mg by mouth in the morning, at noon, in the evening, and at bedtime.   calcium carbonate (CALCIUM 600) 600 MG TABS tablet Take 600 mg by mouth 2 (two) times daily with a meal.   Cholecalciferol 25 MCG (1000 UT) tablet Take 1,000 Units by mouth daily.   guaiFENesin (ROBITUSSIN) 100 MG/5ML liquid Take 30 mLs by mouth every 4 (four) hours as needed for cough or to loosen phlegm.   melatonin 5 MG TABS Take 5 mg by mouth at bedtime.   Multiple Vitamins-Minerals (MULTIVITAMIN ADULT EXTRA C PO) Take 1 tablet by mouth daily.   nystatin (MYCOSTATIN/NYSTOP) powder Apply 1 Application topically 2 (two)  times daily. Under left breast for skin breakdown   OLANZapine (ZYPREXA) 10 MG tablet Take 10 mg by mouth at bedtime.   polyethylene glycol (MIRALAX / GLYCOLAX) 17 g packet Take 17 g by mouth daily as needed.   sertraline (ZOLOFT) 100 MG tablet Take 100 mg by mouth daily.   No facility-administered encounter medications on file as of 11/22/2023.     Review of Systems  Unable to perform ROS: Dementia      Immunization History  Administered Date(s) Administered   INFLUENZA, HIGH DOSE SEASONAL PF 09/27/2017, 08/22/2018, 10/11/2020, 10/21/2021   Influenza-Unspecified 10/29/2022   Moderna Covid-19 Fall Seasonal Vaccine 3yrs & older 11/13/2021   Moderna Covid-19 Vaccine Bivalent  Booster 78yrs & up 04/13/2022   PFIZER Comirnaty(Gray Top)Covid-19 Tri-Sucrose Vaccine 01/17/2019, 02/07/2019, 10/14/2019   PPD Test 12/30/2020   Pfizer Covid-19 Vaccine Bivalent Booster 46yrs & up 10/11/2020   Pneumococcal Conjugate-13 02/23/2019   Pneumococcal Polysaccharide-23 03/26/2020   Tdap 04/01/2022   Unspecified SARS-COV-2 Vaccination 04/16/2023   Zoster Recombinant(Shingrix) 02/23/2019, 04/26/2022   Pertinent  Health Maintenance Due  Topic Date Due   Influenza Vaccine  08/05/2023   DEXA SCAN  Completed   Colonoscopy  Discontinued      10/10/2019   10:54 AM 07/19/2022    3:02 PM 05/03/2023    8:04 AM  Fall Risk  Falls in the past year?  1 1  Was there an injury with Fall?  0 0  Fall Risk Category Calculator  2 2  (RETIRED) Patient Fall Risk Level Moderate fall risk     Patient at Risk for Falls Due to  History of fall(s) History of fall(s);Impaired mobility  Fall risk Follow up   Falls evaluation completed     Data saved with a previous flowsheet row definition   Functional Status Survey:     Vitals:   11/18/23 1343  BP: 111/62  Pulse: 62  Resp: 16  Temp: 98 F (36.7 C)  SpO2: 94%  Weight: 204 lb 1.6 oz (92.6 kg)  Height: 5' 4 (1.626 m)   Body mass index is 35.03 kg/m. Physical Exam Vitals and nursing note reviewed.  Constitutional:      General: She is not in acute distress. HENT:     Head: Normocephalic and atraumatic.  Eyes:     General: No scleral icterus. Cardiovascular:     Rate and Rhythm: Normal rate and regular rhythm.  Pulmonary:     Effort: Pulmonary effort is normal.     Breath sounds: Normal breath sounds.  Abdominal:     Tenderness: There is no abdominal tenderness. There is no guarding.  Musculoskeletal:     Right lower leg: Edema present.     Left lower leg: Edema present.     Comments: +1 pitting edema bilaterally pretibial, ankle, feet.  Patient wearing bilateral compression stockings up to her knees.  Skin:    General: Skin  is warm and dry.     Capillary Refill: Capillary refill takes less than 2 seconds.  Neurological:     Mental Status: She is disoriented.      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  Nursing reports that the patient's anxiety mood disorder have stabilized now that her BuSpar has been changed to 10 mg 4 times daily.  She remains on 5 mg of melatonin at night for sleep, Zoloft 100 mg daily for depression, Zyprexa 10 mg at bedtime due to anxiety and insomnia.  Will not change any of her psychiatric medications at this point due to the patient's stabilization of her mental health in the last 2 months.  Alterations in her psychiatric medications may destabilize her to her detriment. Anxiety 11/22/23 continue with BuSpar 10 mg 4 times daily.  Changing to a more frequent dosing has helped the patient despite it being a lower total daily dosage compared to September 2025.  09-13-2023(3rd qtr 2025). 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  She remains on Zyprexa 10 mg at nighttime to help with her wandering and pacing behavior. Essential (primary) hypertension 11/22/23 BP currently stable without any blood pressure medications. Obesity, Class II, BMI 35-39.9 Body mass index is 35.03 kg/m.  Other insomnia 11/22/23 continue with melatonin 5 mg nightly along with Zyprexa 10 mg nightly  Encounter for medication titration 11/22/23(4th qtr 2025).  Nursing reports that the patient's anxiety mood disorder have stabilized now that her  BuSpar has been changed to 10 mg 4 times daily.  She remains on 5 mg of melatonin at night for sleep, Zoloft 100 mg daily for depression, Zyprexa 10 mg at bedtime due to anxiety and insomnia.  Will not change any of her psychiatric medications at this point due to the patient's stabilization of her mental health in the la  09-13-2023(3rd qtr 2025). Buspar changed from 15 mg TID -->10 mg QID due to continued anxiety and mood disorder     Camellia Door, DO [facility] [phone]

## 2023-11-22 NOTE — Assessment & Plan Note (Addendum)
 11/22/23(4th qtr 2025).  Nursing reports that the patient's anxiety mood disorder have stabilized now that her BuSpar has been changed to 10 mg 4 times daily.  She remains on 5 mg of melatonin at night for sleep, Zoloft 100 mg daily for depression, Zyprexa 10 mg at bedtime due to anxiety and insomnia.  Will not change any of her psychiatric medications at this point due to the patient's stabilization of her mental health in the last 2 months.  Alterations in her psychiatric medications may destabilize her to her detriment.

## 2023-11-22 NOTE — Assessment & Plan Note (Addendum)
 11/22/23 continue with BuSpar 10 mg 4 times daily.  Changing to a more frequent dosing has helped the patient despite it being a lower total daily dosage compared to September 2025.  09-13-2023(3rd qtr 2025). Buspar changed from 15 mg TID -->10 mg QID due to continued anxiety and mood disorder

## 2023-11-22 NOTE — Assessment & Plan Note (Deleted)
11/22/23

## 2023-11-22 NOTE — Assessment & Plan Note (Addendum)
 11/22/23 no further reports of delusions per the nursing staff.  She remains on Zyprexa 10 mg at nighttime to help with her wandering and pacing behavior.

## 2023-11-22 NOTE — Assessment & Plan Note (Addendum)
 11/22/23(4th qtr 2025).  Nursing reports that the patient's anxiety mood disorder have stabilized now that her BuSpar has been changed to 10 mg 4 times daily.  She remains on 5 mg of melatonin at night for sleep, Zoloft 100 mg daily for depression, Zyprexa 10 mg at bedtime due to anxiety and insomnia.  Will not change any of her psychiatric medications at this point due to the patient's stabilization of her mental health in the la  09-13-2023(3rd qtr 2025). Buspar changed from 15 mg TID -->10 mg QID due to continued anxiety and mood disorder

## 2023-12-05 ENCOUNTER — Non-Acute Institutional Stay (SKILLED_NURSING_FACILITY): Payer: Self-pay | Admitting: Nurse Practitioner

## 2023-12-05 DIAGNOSIS — R143 Flatulence: Secondary | ICD-10-CM

## 2023-12-05 NOTE — Progress Notes (Unsigned)
 Location:  Other Nursing Home Room Number: memory care, [place] Place of Service:  SNF (31)  Laurence Locus, DO  Patient Care Team: Laurence Locus, DO as PCP - General (Internal Medicine)  Extended Emergency Contact Information Primary Emergency Contact: [place], [place], [place] [address] [place] Home Phone: [phone] Mobile Phone: [phone] Relation: Son Secondary Emergency Contact: PENNI EL[place], [place] [address] [place] Home Phone: [phone] Mobile Phone: [phone] Relation: Daughter  Goals of care: Advanced Directive information    10/26/2023    8:41 AM  Advanced Directives  Does Patient Have a Medical Advance Directive? Yes  Type of Advance Directive Out of facility DNR (pink MOST or yellow form)  Does patient want to make changes to medical advance directive? No - Patient declined     Chief Complaint  Patient presents with   Acute Visit    protruding belly    HPI:  Pt is a 78 y.o. female seen today for an acute visit for abdominal bloating and distention per staff. Pt with hx of advanced dementia. Staff noted over the last 24 hours she was having increase in bloating. Abdomen has been soft.  No complaints of pain No fevers She has been having regular bowel movements.  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   acetaminophen (TYLENOL) 500 MG tablet Take 1,000 mg by mouth 3 (three) times daily.   aspirin 81 MG chewable tablet Chew 81 mg by mouth every other day.   B Complex Vitamins (VITAMIN B COMPLEX PO) Give 1 tablet by mouth one time a day for supplement   busPIRone (BUSPAR) 5 MG tablet Take 10 mg by mouth in the morning, at noon, in the evening, and at bedtime.   calcium carbonate (CALCIUM 600) 600 MG TABS tablet Take 600 mg by mouth 2 (two) times daily with a meal.   Cholecalciferol 25 MCG (1000 UT) tablet Take 1,000 Units by mouth daily.   guaiFENesin (ROBITUSSIN) 100 MG/5ML liquid Take 30 mLs by mouth every 4 (four) hours as needed for cough or to loosen phlegm.   melatonin 5 MG TABS Take 5 mg by mouth at bedtime.   Multiple Vitamins-Minerals (MULTIVITAMIN ADULT EXTRA C PO) Take 1 tablet by mouth daily.   nystatin (MYCOSTATIN/NYSTOP) powder Apply 1 Application topically 2 (two) times daily. Under left breast for skin breakdown   OLANZapine (ZYPREXA) 10 MG tablet Take 10 mg by mouth at bedtime.   polyethylene glycol (MIRALAX /  GLYCOLAX) 17 g packet Take 17 g by mouth daily as needed.   sertraline (ZOLOFT) 100 MG tablet Take 100 mg by mouth daily.   No facility-administered encounter medications on file as of 12/05/2023.    Review of Systems  Unable to perform ROS: Dementia    Immunization History  Administered Date(s) Administered   INFLUENZA, HIGH DOSE SEASONAL PF 09/27/2017, 08/22/2018, 10/11/2020, 10/21/2021   Influenza-Unspecified 10/29/2022   Moderna Covid-19 Fall Seasonal Vaccine 4yrs & older 11/13/2021   Moderna Covid-19 Vaccine Bivalent Booster 12yrs & up 04/13/2022   PFIZER Comirnaty(Gray Top)Covid-19 Tri-Sucrose Vaccine 01/17/2019, 02/07/2019,  10/14/2019   PPD Test 12/30/2020   Pfizer Covid-19 Vaccine Bivalent Booster 26yrs & up 10/11/2020   Pneumococcal Conjugate-13 02/23/2019   Pneumococcal Polysaccharide-23 03/26/2020   Tdap 04/01/2022   Unspecified SARS-COV-2 Vaccination 04/16/2023   Zoster Recombinant(Shingrix) 02/23/2019, 04/26/2022   Pertinent  Health Maintenance Due  Topic Date Due   Influenza Vaccine  08/05/2023   Bone Density Scan  Completed   Colonoscopy  Discontinued      10/10/2019   10:54 AM 07/19/2022    3:02 PM 05/03/2023    8:04 AM  Fall Risk  Falls in the past year?  1 1  Was there an injury with Fall?  0  0   Fall Risk Category Calculator  2 2  (RETIRED) Patient Fall Risk Level Moderate fall risk     Patient at Risk for Falls Due to  History of fall(s) History of fall(s);Impaired mobility  Fall risk Follow up   Falls evaluation completed     Data saved with a previous flowsheet row definition   Functional Status Survey:    Vitals:   12/05/23 0924  BP: (!) 146/81  Pulse: 64  Resp: 18  Temp: 98.9 F (37.2 C)   There is no height or weight on file to calculate BMI. Physical Exam Constitutional:      Appearance: Normal appearance.  Cardiovascular:     Rate and Rhythm: Normal rate.  Pulmonary:     Effort: Pulmonary effort is normal.  Abdominal:     General: Bowel sounds are normal. There is distension.     Palpations: Abdomen is soft.     Tenderness: There is no abdominal tenderness. There is no guarding.  Neurological:     Mental Status: She is alert. Mental status is at baseline.  Psychiatric:        Mood and Affect: Mood normal.     Labs reviewed: Recent Labs    05/18/23 0000 09/08/23 0000  NA 141 142  K 3.9 4.1  CL 104 106  CO2 30* 26*  BUN 25* 22*  CREATININE 1.0 0.9  CALCIUM 9.7 9.6   No results for input(s): AST, ALT, ALKPHOS, BILITOT, PROT, ALBUMIN in the last 8760 hours. Recent Labs    05/18/23 0000  WBC 5.4  NEUTROABS 3,143.00  HGB 11.6*  HCT  36  PLT 272   No results found for: TSH No results found for: HGBA1C No results found for: CHOL, HDL, LDLCALC, LDLDIRECT, TRIG, CHOLHDL  Significant Diagnostic Results in last 30 days:  No results found.  Assessment/Plan 1. Flatulence (Primary) Flatulence noted by staff and increase today No pain noted Continues to have regular BMs Discussed dietary and lifestyle modifications Will monitor.   Ritika Hellickson K. Caro BODILY [facility] [phone]

## 2023-12-27 ENCOUNTER — Encounter: Payer: Self-pay | Admitting: Nurse Practitioner

## 2023-12-27 ENCOUNTER — Non-Acute Institutional Stay: Payer: Self-pay | Admitting: Nurse Practitioner

## 2023-12-27 DIAGNOSIS — M1991 Primary osteoarthritis, unspecified site: Secondary | ICD-10-CM | POA: Diagnosis not present

## 2023-12-27 DIAGNOSIS — F419 Anxiety disorder, unspecified: Secondary | ICD-10-CM

## 2023-12-27 DIAGNOSIS — G4709 Other insomnia: Secondary | ICD-10-CM

## 2023-12-27 DIAGNOSIS — F02B3 Dementia in other diseases classified elsewhere, moderate, with mood disturbance: Secondary | ICD-10-CM

## 2023-12-27 DIAGNOSIS — G301 Alzheimer's disease with late onset: Secondary | ICD-10-CM

## 2023-12-27 DIAGNOSIS — I10 Essential (primary) hypertension: Secondary | ICD-10-CM

## 2023-12-27 NOTE — Assessment & Plan Note (Signed)
 Continues on scheduled tylenol Pain controlled

## 2023-12-27 NOTE — Assessment & Plan Note (Signed)
 Advanced dementia Continues with support of staff Behaviors improved at this time Continues on zyprexa due to behaviors and delusion disorder. Would not recommend titration at this time.

## 2023-12-27 NOTE — Assessment & Plan Note (Signed)
 Continues on melatonin at bedtime.

## 2023-12-27 NOTE — Assessment & Plan Note (Signed)
 Stable at this time, continue current regimen due to hard to control anxiety.

## 2023-12-27 NOTE — Assessment & Plan Note (Signed)
 Blood pressure well controlled, goal bp <140/90 Not currently on medications

## 2023-12-27 NOTE — Progress Notes (Signed)
 " Location:  Other [place].  Nursing Home Room Number: [facility] Place of Service:  ALF [phone]) Kimberly An, NP  PCP: Laurence Locus, DO  Patient Care Team: Laurence Locus, DO as PCP - General (Internal Medicine)  Extended Emergency Contact Information Primary Emergency Contact: Kimberly Small, [place] [address] [place] Home Phone: [phone] Mobile Phone: [phone] Relation: Son Secondary Emergency Contact: Kimberly Small, [place] [address] [place] Home Phone: [phone] Mobile Phone: [phone] Relation: Daughter  Goals of care: Advanced Directive information    12/27/2023   11:20 AM  Advanced Directives  Does Patient Have a Medical Advance Directive? Yes  Type of Advance Directive Out of facility DNR (pink MOST or yellow form)  Does patient want to make changes to medical advance directive? No - Patient declined     Chief Complaint  Patient presents with   Medical Management of Chronic Issues    Medical Management of Chronic Issues.     HPI:  Pt is a 78 y.o. female seen today for medical management of chronic disease.  Pt with advance dementia Needing total care.  Has to be fed, she eats well. Medications have to be crushed No constipation or diarrhea, continues to have increase in gas.  Behaviors are stable on her buspar with zoloft Continues on melatonin at bedtime to aid sleep Nursing has no acute concerns at this tme.    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   acetaminophen (TYLENOL) 500 MG tablet Take 1,000 mg by mouth 3 (three) times daily.   aspirin 81 MG chewable tablet Chew 81 mg by mouth every other day.   B Complex Vitamins (VITAMIN B COMPLEX PO) Give 1 tablet by mouth one time a day for supplement   busPIRone (BUSPAR) 5 MG tablet Take 10 mg by mouth in the morning, at noon, in the evening, and at bedtime.   calcium carbonate (CALCIUM 600) 600 MG TABS tablet Take 600 mg by mouth 2 (two) times daily with a meal.   Cholecalciferol 25 MCG (1000 UT) tablet Take 1,000 Units by mouth daily.   guaiFENesin (ROBITUSSIN) 100 MG/5ML liquid Take 30 mLs by mouth every 4 (four) hours as needed for cough or to loosen phlegm.   melatonin 5 MG TABS Take 5 mg by mouth at bedtime.   Multiple Vitamins-Minerals (MULTIVITAMIN ADULT EXTRA C PO) Take 1 tablet by mouth daily.   nystatin (MYCOSTATIN/NYSTOP) powder Apply 1 Application topically 2 (two) times daily. Under left breast for skin breakdown   OLANZapine (ZYPREXA) 10 MG tablet Take 10 mg by mouth  at bedtime.   polyethylene glycol (MIRALAX / GLYCOLAX) 17 g packet Take 17 g by mouth daily as needed.   sertraline (ZOLOFT) 100 MG tablet Take 100 mg by mouth daily.   No facility-administered encounter medications on file as of 12/27/2023.    Review of Systems  Unable to perform ROS: Dementia     Immunization History  Administered Date(s) Administered   INFLUENZA, HIGH DOSE SEASONAL PF 09/27/2017, 08/22/2018, 10/11/2020, 10/21/2021   Influenza-Unspecified 10/29/2022   Moderna Covid-19 Fall Seasonal Vaccine 17yrs & older 11/13/2021   Moderna Covid-19 Vaccine Bivalent Booster 10yrs & up 04/13/2022   PFIZER  Comirnaty(Gray Top)Covid-19 Tri-Sucrose Vaccine 01/17/2019, 02/07/2019, 10/14/2019   PPD Test 12/30/2020   Pfizer Covid-19 Vaccine Bivalent Booster 23yrs & up 10/11/2020   Pneumococcal Conjugate-13 02/23/2019   Pneumococcal Polysaccharide-23 03/26/2020   Tdap 04/01/2022   Unspecified SARS-COV-2 Vaccination 04/16/2023, 10/28/2023   Zoster Recombinant(Shingrix) 02/23/2019, 04/26/2022   Pertinent  Health Maintenance Due  Topic Date Due   Influenza Vaccine  08/05/2023   Bone Density Scan  Completed   Colonoscopy  Discontinued      10/10/2019   10:54 AM 07/19/2022    3:02 PM 05/03/2023    8:04 AM  Fall Risk  Falls in the past year?  1 1  Was there Small injury with Fall?  0  0   Fall Risk Category Calculator  2 2  (RETIRED) Patient Fall Risk Level Moderate fall risk     Patient at Risk for Falls Due to  History of fall(s) History of fall(s);Impaired mobility  Fall risk Follow up   Falls evaluation completed     Data saved with a previous flowsheet row definition   Functional Status Survey:    Vitals:   12/27/23 1106  BP: 127/75  Pulse: 67  Resp: 16  Temp: 99.1 F (37.3 C)  SpO2: 94%  Weight: 203 lb 9.6 oz (92.4 kg)  Height: 5' 4 (1.626 m)   Body mass index is 34.95 kg/m. Physical Exam Constitutional:      General: She is not in acute distress.    Appearance: She is well-developed. She is not diaphoretic.  HENT:     Head: Normocephalic and atraumatic.     Mouth/Throat:     Pharynx: No oropharyngeal exudate.  Eyes:     Conjunctiva/sclera: Conjunctivae normal.     Pupils: Pupils are equal, round, and reactive to light.  Cardiovascular:     Rate and Rhythm: Normal rate and regular rhythm.     Heart sounds: Normal heart sounds.  Pulmonary:     Effort: Pulmonary effort is normal.     Breath sounds: Normal breath sounds.  Abdominal:     General: Bowel sounds are normal.     Palpations: Abdomen is soft.  Musculoskeletal:     Cervical back: Normal range of motion and  neck supple.     Right lower leg: No edema.     Left lower leg: No edema.  Skin:    General: Skin is warm and dry.  Neurological:     Mental Status: She is alert. She is disoriented.     Motor: Weakness present.     Gait: Gait abnormal.  Psychiatric:        Mood and Affect: Mood normal.     Labs reviewed: Recent Labs    09/08/23 0000 10/26/23 0000 11/10/23 0000  NA 142 140 143  K 4.1 3.8 4.0  CL 106 105 106  CO2 26* 27* 26*  BUN 22* 36* 36*  CREATININE 0.9 0.9 0.9  CALCIUM 9.6 9.6 9.7   Recent Labs    11/10/23 0000  AST 17  ALT 12  ALKPHOS 50  ALBUMIN 4.0   Recent Labs    05/18/23 0000 10/26/23 0000 11/10/23 0000  WBC 5.4 5.4 6.7  NEUTROABS 3,143.00 3,310.00 4,415.00  HGB 11.6* 10.3* 10.7*  HCT 36 33* 33*  PLT 272 250 245   No results found for: TSH No results found for: HGBA1C No results found for: CHOL, HDL, LDLCALC, LDLDIRECT, TRIG, CHOLHDL  Significant Diagnostic Results in last 30 days:  No results found.  Assessment/Plan Primary osteoarthritis Continues on scheduled tylenol Pain controlled  Other insomnia Continues on melatonin at bedtime .   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 Would not recommend titration at this time.   Essential (primary) hypertension Blood pressure well controlled, goal bp <140/90 Not currently on medications    Anxiety Stable at this time, continue current regimen due to hard to control anxiety.    Kimberly Small K. Caro BODILY [facility] [phone]       [1]  Allergies Allergen Reactions   Erythromycin Rash   "

## 2024-02-01 ENCOUNTER — Non-Acute Institutional Stay: Admitting: Nurse Practitioner

## 2024-02-01 ENCOUNTER — Encounter: Payer: Self-pay | Admitting: Nurse Practitioner

## 2024-02-01 DIAGNOSIS — E66811 Obesity, class 1: Secondary | ICD-10-CM | POA: Diagnosis not present

## 2024-02-01 DIAGNOSIS — G301 Alzheimer's disease with late onset: Secondary | ICD-10-CM | POA: Diagnosis not present

## 2024-02-01 DIAGNOSIS — M1991 Primary osteoarthritis, unspecified site: Secondary | ICD-10-CM

## 2024-02-01 DIAGNOSIS — I10 Essential (primary) hypertension: Secondary | ICD-10-CM | POA: Diagnosis not present

## 2024-02-01 DIAGNOSIS — E6609 Other obesity due to excess calories: Secondary | ICD-10-CM | POA: Diagnosis not present

## 2024-02-01 DIAGNOSIS — F419 Anxiety disorder, unspecified: Secondary | ICD-10-CM | POA: Diagnosis not present

## 2024-02-01 DIAGNOSIS — F02B3 Dementia in other diseases classified elsewhere, moderate, with mood disturbance: Secondary | ICD-10-CM

## 2024-02-01 DIAGNOSIS — Z6832 Body mass index (BMI) 32.0-32.9, adult: Secondary | ICD-10-CM

## 2024-02-01 DIAGNOSIS — G4709 Other insomnia: Secondary | ICD-10-CM | POA: Diagnosis not present

## 2024-02-01 NOTE — Assessment & Plan Note (Signed)
 Stable at this time, continue current regimen due to hard to control anxiety.

## 2024-02-01 NOTE — Assessment & Plan Note (Signed)
 Continues on melatonin at bedtime.

## 2024-02-01 NOTE — Assessment & Plan Note (Signed)
 Continues on scheduled tylenol Pain controlled

## 2024-02-01 NOTE — Assessment & Plan Note (Signed)
 Advanced dementia Continues with support of staff Behaviors improved at this time Continues on zyprexa due to behaviors and delusion disorder. Would not recommend titration at this time.

## 2024-02-01 NOTE — Progress Notes (Signed)
 " Location:  Other [place].  Nursing Home Room Number: [facility] Place of Service:  ALF [phone]) [name], NP  PCP: Laurence Locus, DO  Patient Care Team: Laurence Locus, DO as PCP - General (Internal Medicine)  Extended Emergency Contact Information Primary Emergency Contact: Koleen, [place], [place] [address] [place] Home Phone: [phone] Mobile Phone: [phone] Relation: Son Secondary Emergency Contact: PENNI ELIDA CHUCK, [place] [address] [place] Home Phone: [phone] Mobile Phone: [phone] Relation: Daughter  Goals of care: Advanced Directive information    12/27/2023   11:20 AM  Advanced Directives  Does Patient Have a Medical Advance Directive? Yes  Type of Advance Directive Out of facility DNR (pink MOST or yellow form)  Does patient want to make changes to medical advance directive? No - Patient declined     Chief Complaint  Patient presents with   Medical Management of Chronic Issues    Medical Management of Chronic Issues.     HPI:  Pt is a 79 y.o. female seen today for medical management of chronic disease. Pt with hx of htn, dementia, anxiety, OA.  She has lost weight but continues to eat well per staff.  BMI is considered obese.  She is on scheduled tylenol without complaints or signs of pain.  She is on zoloft and buspar for her anxiety which has been well controlled recently She has advanced dementia and requires total care by staff.  No acute issues or concerns per nursing.    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   aspirin 81 MG chewable tablet Chew 81 mg by mouth every other day.   B Complex Vitamins (VITAMIN B COMPLEX PO) Give 1 tablet by mouth one time a day for supplement   busPIRone (BUSPAR) 5 MG tablet Take 10 mg by mouth in the morning, at noon, in the evening, and at bedtime.   melatonin 5 MG TABS Take 5 mg by mouth at bedtime.   nystatin (MYCOSTATIN/NYSTOP) powder Apply 1 Application topically 2 (two) times daily. Under left breast for skin breakdown   OLANZapine (ZYPREXA) 10 MG tablet Take 10 mg by mouth at bedtime.   polyethylene glycol (MIRALAX / GLYCOLAX) 17 g packet Take 17 g by mouth daily as needed.   sertraline (ZOLOFT) 100 MG tablet Take 100 mg by mouth daily.   acetaminophen (TYLENOL) 325 MG tablet Take 650 mg by mouth every 4 (four) hours as needed. (Patient not taking: Reported on 02/01/2024)   calcium carbonate (CALCIUM 600) 600 MG TABS tablet Take 600 mg by mouth 2 (two) times daily with a  meal. (Patient not taking: Reported on 02/01/2024)   Cholecalciferol 25 MCG (1000 UT) tablet Take 1,000 Units by mouth daily. (Patient not taking: Reported on 02/01/2024)   guaiFENesin (ROBITUSSIN) 100 MG/5ML liquid Take 30 mLs by mouth every 4 (four) hours as needed for cough or to loosen phlegm. (Patient not taking: Reported on 02/01/2024)   Multiple Vitamins-Minerals (MULTIVITAMIN ADULT EXTRA C PO) Take 1 tablet by mouth daily. (Patient not taking: Reported on 02/01/2024)   No facility-administered encounter medications on file as of 02/01/2024.    Review of Systems  Unable to perform ROS: Dementia     Immunization History  Administered  Date(s) Administered   INFLUENZA, HIGH DOSE SEASONAL PF 09/27/2017, 08/22/2018, 10/11/2020, 10/21/2021   Influenza-Unspecified 10/29/2022, 10/18/2023   Moderna Covid-19 Fall Seasonal Vaccine 71yrs & older 11/13/2021   Moderna Covid-19 Vaccine Bivalent Booster 67yrs & up 04/13/2022   PFIZER Comirnaty(Gray Top)Covid-19 Tri-Sucrose Vaccine 01/17/2019, 02/07/2019, 10/14/2019   PPD Test 12/30/2020   Pfizer Covid-19 Vaccine Bivalent Booster 78yrs & up 10/11/2020   Pneumococcal Conjugate-13 02/23/2019   Pneumococcal Polysaccharide-23 03/26/2020   Tdap 04/01/2022   Unspecified SARS-COV-2 Vaccination 04/16/2023, 10/28/2023   Zoster Recombinant(Shingrix) 02/23/2019, 04/26/2022   Pertinent  Health Maintenance Due  Topic Date Due   Influenza Vaccine  Completed   Bone Density Scan  Completed   Colonoscopy  Discontinued      10/10/2019   10:54 AM 07/19/2022    3:02 PM 05/03/2023    8:04 AM  Fall Risk  Falls in the past year?  1 1  Was there an injury with Fall?  0  0   Fall Risk Category Calculator  2 2  (RETIRED) Patient Fall Risk Level Moderate fall risk     Patient at Risk for Falls Due to  History of fall(s) History of fall(s);Impaired mobility  Fall risk Follow up   Falls evaluation completed     Data saved with a previous flowsheet row definition   Functional Status Survey:    Vitals:   02/01/24 0838  BP: 129/75  Pulse: (!) 52  Resp: 16  Temp: 99.6 F (37.6 C)  SpO2: 94%  Weight: 190 lb 9.6 oz (86.5 kg)  Height: 5' 4 (1.626 m)   Body mass index is 32.72 kg/m. Wt Readings from Last 3 Encounters:  02/01/24 190 lb 9.6 oz (86.5 kg)  12/27/23 203 lb 9.6 oz (92.4 kg)  11/18/23 204 lb 1.6 oz (92.6 kg)    Physical Exam Constitutional:      General: She is not in acute distress.    Appearance: She is well-developed. She is not diaphoretic.  HENT:     Head: Normocephalic and atraumatic.     Mouth/Throat:     Pharynx: No oropharyngeal exudate.  Eyes:      Conjunctiva/sclera: Conjunctivae normal.     Pupils: Pupils are equal, round, and reactive to light.  Cardiovascular:     Rate and Rhythm: Normal rate and regular rhythm.     Heart sounds: Normal heart sounds.  Pulmonary:     Effort: Pulmonary effort is normal.     Breath sounds: Normal breath sounds.  Abdominal:     General: Bowel sounds are normal.     Palpations: Abdomen is soft.  Musculoskeletal:     Cervical back: Normal range of motion and neck supple.     Right lower leg: No edema.     Left lower leg: No edema.  Skin:    General: Skin is warm and  dry.  Neurological:     Mental Status: She is alert. Mental status is at baseline. She is disoriented.     Motor: Weakness present.     Gait: Gait abnormal.  Psychiatric:        Mood and Affect: Mood normal.     Labs reviewed: Recent Labs    09/08/23 0000 10/26/23 0000 11/10/23 0000  NA 142 140 143  K 4.1 3.8 4.0  CL 106 105 106  CO2 26* 27* 26*  BUN 22* 36* 36*  CREATININE 0.9 0.9 0.9  CALCIUM 9.6 9.6 9.7   Recent Labs    11/10/23 0000  AST 17  ALT 12  ALKPHOS 50  ALBUMIN 4.0   Recent Labs    05/18/23 0000 10/26/23 0000 11/10/23 0000  WBC 5.4 5.4 6.7  NEUTROABS 3,143.00 3,310.00 4,415.00  HGB 11.6* 10.3* 10.7*  HCT 36 33* 33*  PLT 272 250 245   No results found for: TSH No results found for: HGBA1C No results found for: CHOL, HDL, LDLCALC, LDLDIRECT, TRIG, CHOLHDL  Significant Diagnostic Results in last 30 days:  No results found.  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 Would not recommend titration at this time.   Essential (primary) hypertension Blood pressure well controlled, goal bp <140/90 Not currently on  medications    Anxiety Stable at this time, continue current regimen due to hard to control anxiety.   Other insomnia Continues on melatonin at bedtime .      Kimberly Small [facility] [phone]       [1]  Allergies Allergen Reactions   Erythromycin Rash   "

## 2024-02-01 NOTE — Assessment & Plan Note (Signed)
 Blood pressure well controlled, goal bp <140/90 Not currently on medications
